# Patient Record
Sex: Male | Born: 1985
Health system: Southern US, Community
[De-identification: ages and names within clinical notes are randomized; demographics above are authoritative.]

## PROBLEM LIST (undated history)

## (undated) HISTORY — PX: INGUINAL HERNIA REPAIR: SUR1180

---
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
## 2008-09-25 ENCOUNTER — Ambulatory Visit: Payer: Self-pay | Admitting: Diagnostic Radiology

## 2008-09-25 ENCOUNTER — Emergency Department (HOSPITAL_BASED_OUTPATIENT_CLINIC_OR_DEPARTMENT_OTHER): Admission: EM | Admit: 2008-09-25 | Discharge: 2008-09-25 | Payer: Self-pay | Admitting: Emergency Medicine

## 2009-05-11 ENCOUNTER — Emergency Department (HOSPITAL_COMMUNITY): Admission: EM | Admit: 2009-05-11 | Discharge: 2009-05-11 | Payer: Self-pay | Admitting: Emergency Medicine

## 2010-02-01 ENCOUNTER — Emergency Department (HOSPITAL_COMMUNITY): Admission: EM | Admit: 2010-02-01 | Discharge: 2010-02-01 | Payer: Self-pay | Admitting: Family Medicine

## 2011-10-01 ENCOUNTER — Emergency Department (HOSPITAL_BASED_OUTPATIENT_CLINIC_OR_DEPARTMENT_OTHER)
Admission: EM | Admit: 2011-10-01 | Discharge: 2011-10-01 | Disposition: A | Discharge status: Home / Self Care | Payer: Self-pay | Attending: Emergency Medicine | Admitting: Emergency Medicine

## 2011-10-01 ENCOUNTER — Emergency Department (INDEPENDENT_AMBULATORY_CARE_PROVIDER_SITE_OTHER): Payer: Self-pay

## 2011-10-01 DIAGNOSIS — S61209A Unspecified open wound of unspecified finger without damage to nail, initial encounter: Secondary | ICD-10-CM

## 2011-10-01 DIAGNOSIS — W208XXA Other cause of strike by thrown, projected or falling object, initial encounter: Secondary | ICD-10-CM | POA: Insufficient documentation

## 2011-10-01 DIAGNOSIS — S68119A Complete traumatic metacarpophalangeal amputation of unspecified finger, initial encounter: Secondary | ICD-10-CM

## 2011-10-01 DIAGNOSIS — IMO0002 Reserved for concepts with insufficient information to code with codable children: Secondary | ICD-10-CM | POA: Insufficient documentation

## 2011-10-01 DIAGNOSIS — X58XXXA Exposure to other specified factors, initial encounter: Secondary | ICD-10-CM

## 2011-10-01 DIAGNOSIS — M25549 Pain in joints of unspecified hand: Secondary | ICD-10-CM

## 2011-10-01 MED ORDER — OXYCODONE-ACETAMINOPHEN 5-325 MG PO TABS
1.0000 | ORAL_TABLET | Freq: Once | ORAL | Status: AC
  Administered 2011-10-01: 1 via ORAL
  Filled 2011-10-01: qty 1

## 2011-10-01 MED ORDER — TETANUS-DIPHTH-ACELL PERTUSSIS 5-2.5-18.5 LF-MCG/0.5 IM SUSP
0.5000 mL | Freq: Once | INTRAMUSCULAR | Status: AC
  Administered 2011-10-01: 0.5 mL via INTRAMUSCULAR

## 2011-10-01 MED ORDER — LIDOCAINE HCL (PF) 1 % IJ SOLN: 5.0000 mL | Freq: Once | INTRAMUSCULAR | Status: DC

## 2011-10-01 MED ORDER — OXYCODONE-ACETAMINOPHEN 5-325 MG PO TABS: 1.0000 | ORAL_TABLET | Freq: Four times a day (QID) | ORAL | Status: AC | PRN

## 2011-10-01 MED ORDER — TETANUS-DIPHTH-ACELL PERTUSSIS 5-2.5-18.5 LF-MCG/0.5 IM SUSP
INTRAMUSCULAR | Status: AC
  Administered 2011-10-01: 0.5 mL via INTRAMUSCULAR
  Filled 2011-10-01: qty 0.5

## 2011-10-01 NOTE — ED Provider Notes (Signed)
History     CSN: [id number] Arrival date & time: 10/01/2011  1:59 AM   First MD Initiated Contact with Patient 10/01/11 0205      Chief Complaint  Patient presents with  . Finger Injury    (Consider location/radiation/quality/duration/timing/severity/associated sxs/prior treatment) HPI Patient is a 25 year old male who presents today after sustaining a complete fingertip avulsion injury. Patient dropped a straight razor on the radial surface of his left first digit. Patient essentially shaved off an area 1 cm in length and a half centimeter in diameter but also caught the corner of the affected digit fingernail on the radial aspect.  Avulsed segment is brought with the patient and is already ischemic in appearance. It is also only a 1-2 mm thick. Patient has bleeding but no other significant injuries. Does not appear to be bone involvement. Patient does not recall when his last tetanus shot was. 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 He's not a smoker. Patient is otherwise uninjured and neurovascularly intact. There are no other associated or modifying factors. Patient reports his pain as a 5/10. Pain and worse with touching and better with not touching his wound.   History reviewed. No pertinent past medical history.  Past Surgical History  Procedure Date  . Inguinal hernia repair     History reviewed. No pertinent family history.  History  Substance Use Topics  . Smoking status: Never Smoker   . Smokeless tobacco: Never Used  . Alcohol Use: Yes      Review of Systems  Constitutional: Negative.   HENT: Negative.   Eyes: Negative.   Respiratory: Negative.   Cardiovascular: Negative.   Gastrointestinal: Negative.   Genitourinary: Negative.   Musculoskeletal:       Left fingertip injury  Skin:       Fingertip avulsion injury of the left index finger   Neurological: Negative.   Hematological: Negative.   Psychiatric/Behavioral: Negative.   All other  systems reviewed and are negative.    Allergies  Review of patient's allergies indicates no known allergies.  Home Medications  No current outpatient prescriptions on file.  BP 136/77  Pulse 57  Temp(Src) 98.9 F (37.2 C) (Oral)  Resp 16  Ht 6\' 5"  (1.956 m)  Wt 208 lb (94.348 kg)  BMI 24.67 kg/m2  SpO2 99%  Physical Exam  Nursing note and vitals reviewed. Constitutional: He is oriented to person, place, and time. He appears well-developed and well-nourished. No distress.  HENT:  Head: Normocephalic and atraumatic.  Eyes: Conjunctivae and EOM are normal. Pupils are equal, round, and reactive to light.  Neck: Normal range of motion.  Musculoskeletal: He exhibits no edema.       Left index finger has complete avulsion of tissue on the radial aspect with avulsed segment measuring proximally 1 cm in length by 1/2 cm in diameter by 2 mm in thickness. Patient has minimal bleeding from the affected area. There is a small segment of the corner of the fingernail attached to the avulsed area. No bone is exposed. This appears to be a relatively superficial injury. Sensation and range of motion are intact.  Neurological: He is alert and oriented to person, place, and time. No cranial nerve deficit. Coordination normal.  Skin: Skin is warm and dry. No rash noted.       See musculoskeletal for description of left index finger wound  Psychiatric: He has a normal mood and affect.    ED Course  Procedures (including critical care  time)  Labs Reviewed - No data to display Dg Finger Index Left  10/01/2011  *RADIOLOGY REPORT*  Clinical Data: Laceration to distal tip of second finger.  LEFT INDEX FINGER 2+V  Comparison: None.  Findings: Soft tissue avulsion of the distal aspect of the left second finger.  Underlying bones appear intact.  No evidence of acute fracture or subluxation.  No focal bone lesions.  No radiopaque foreign bodies demonstrated in the soft tissues.  IMPRESSION: Soft tissue  avulsion to the distal left second finger.  No underlying bony abnormality.  Original Report Authenticated By: Marlon Pel, M.D.     1. Traumatic amputation of fingertip       MDM  Patient is a well-appearing male who presents with a left index fingertip avulsion injury. Patient is injured on his nondominant hand. He is not having significant amount of bleeding at this time. Patient had his wound cleansed and received a tetanus shot. Plain film was performed to confirm no bony involvement. This was negative. Patient had wound care performed. He was given the name of hand surgeon on call for followup. Patient was instructed that he should followup in 5-7 days. Patient had splint applied to protect the affected digit. He was provided with discharge instructions as well as wound care supplies. He was given a dose of pain medication and discharged with pain medication prescription. He is welcome to return for other emergent concerns. Note was sent via electronic medical record to hand surgeon on call indicating that patient had been referred. No call was placed given the noncritical nature of the patient's injury as well as the time of visit.        Cyndra Numbers, MD 10/01/11 [phone]

## 2011-10-01 NOTE — ED Notes (Signed)
Pt cut the tip of his L index finger off with a razor blade.  Uncertain of when last tetanus was.

## 2011-11-03 ENCOUNTER — Encounter (HOSPITAL_BASED_OUTPATIENT_CLINIC_OR_DEPARTMENT_OTHER): Payer: Self-pay

## 2011-11-03 ENCOUNTER — Emergency Department (HOSPITAL_BASED_OUTPATIENT_CLINIC_OR_DEPARTMENT_OTHER)
Admission: EM | Admit: 2011-11-03 | Discharge: 2011-11-03 | Disposition: A | Discharge status: Home / Self Care | Payer: Self-pay | Attending: Emergency Medicine | Admitting: Emergency Medicine

## 2011-11-03 DIAGNOSIS — R197 Diarrhea, unspecified: Secondary | ICD-10-CM | POA: Insufficient documentation

## 2011-11-03 DIAGNOSIS — R1110 Vomiting, unspecified: Secondary | ICD-10-CM | POA: Insufficient documentation

## 2011-11-03 DIAGNOSIS — R7989 Other specified abnormal findings of blood chemistry: Secondary | ICD-10-CM

## 2011-11-03 DIAGNOSIS — R799 Abnormal finding of blood chemistry, unspecified: Secondary | ICD-10-CM | POA: Insufficient documentation

## 2011-11-03 LAB — BASIC METABOLIC PANEL
BUN: 18 mg/dL (ref 6–23)
Calcium: 9.4 mg/dL (ref 8.4–10.5)
Creatinine, Ser: 1.4 mg/dL — ABNORMAL HIGH (ref 0.50–1.35)
GFR calc Af Amer: 79 mL/min — ABNORMAL LOW (ref >90)
GFR calc non Af Amer: 68 mL/min — ABNORMAL LOW (ref >90)

## 2011-11-03 MED ORDER — SODIUM CHLORIDE 0.9 % IV BOLUS (SEPSIS)
1000.0000 mL | Freq: Once | INTRAVENOUS | Status: AC
  Administered 2011-11-03: 1000 mL via INTRAVENOUS

## 2011-11-03 MED ORDER — ONDANSETRON HCL 4 MG/2ML IJ SOLN
4.0000 mg | Freq: Once | INTRAMUSCULAR | Status: AC
  Administered 2011-11-03: 4 mg via INTRAVENOUS
  Filled 2011-11-03: qty 2

## 2011-11-03 NOTE — ED Provider Notes (Signed)
History     CSN: [id number]  Arrival date & time 11/03/11  1801   First MD Initiated Contact with Patient 11/03/11 1826      No chief complaint on file.   (Consider location/radiation/quality/duration/timing/severity/associated sxs/prior treatment) HPI Comments: Pt states that she was having multiple episodes of diarrhea pt states that he only vomits when he makes himself do it:pt states that he is having cramping diffusely without any localized pain  Patient is a 26 y.o. male presenting with vomiting. The history is provided by the patient. No language interpreter was used.  Emesis  This is a new problem. The current episode started more than 1 week ago. The problem occurs 2 to 4 times per day. The problem has not changed since onset.The maximum temperature recorded prior to his arrival was 100 to 100.9 F. Associated symptoms include abdominal pain and diarrhea.    History reviewed. No pertinent past medical history.  Past Surgical History  Procedure Date  . Inguinal hernia repair     No family history on file.  History  Substance Use Topics  . Smoking status: Never Smoker   . Smokeless tobacco: Never Used  . Alcohol Use: Yes      Review of Systems  Gastrointestinal: Positive for vomiting, abdominal pain and diarrhea.  All other systems reviewed and are negative.    Allergies  Review of patient's allergies indicates no known allergies.  Home Medications   Current Outpatient Rx  Name Route Sig Dispense Refill  . BISMUTH SUBSALICYLATE 262 MG/15ML PO SUSP Oral Take 15 mLs by mouth once as needed. For upset stomach    . SIMETHICONE 80 MG PO CHEW Oral Chew 160 mg by mouth once as needed. For gas      BP 120/75  Pulse 65  Temp(Src) 98.6 F (37 C) (Oral)  Resp 18  Ht 6\' 4"  (1.93 m)  Wt 208 lb (94.348 kg)  BMI 25.32 kg/m2  SpO2 100%  Physical Exam  Nursing note reviewed. Constitutional: He is oriented to person, place, and time. He appears well-developed  and well-nourished.  HENT:  Head: Normocephalic.  Eyes: Conjunctivae and EOM are normal.  Neck: Neck supple.  Cardiovascular: Normal rate and regular rhythm.   Pulmonary/Chest: Effort normal and breath sounds normal.  Abdominal: Soft. Bowel sounds are increased. There is no tenderness.  Musculoskeletal: Normal range of motion.  Neurological: He is alert and oriented to person, place, and time.  Skin: Skin is warm and dry.  Psychiatric: He has a normal mood and affect.    ED Course  Procedures (including critical care time)  Labs Reviewed  BASIC METABOLIC PANEL - Abnormal; Notable for the following:    Creatinine, Ser 1.40 (*)    GFR calc non Af Amer 68 (*)    GFR calc Af Amer 79 (*)    All other components within normal limits   No results found.   1. Vomiting and diarrhea   2. Elevated serum creatinine       MDM  Pt tolerating po here:pt given fluids and discussed the need to have cr rechecked        Teressa Lower, NP 11/03/11 2115

## 2011-11-03 NOTE — ED Provider Notes (Signed)
Medical screening examination/treatment/procedure(s) were performed by non-physician practitioner and as supervising physician I was immediately available for consultation/collaboration.   Nat Christen, MD 11/03/11 2138

## 2014-03-31 ENCOUNTER — Encounter (HOSPITAL_COMMUNITY): Payer: Self-pay | Admitting: Emergency Medicine

## 2014-03-31 ENCOUNTER — Emergency Department (HOSPITAL_COMMUNITY)
Admission: EM | Admit: 2014-03-31 | Discharge: 2014-04-01 | Disposition: A | Discharge status: Home / Self Care | Payer: BC Managed Care – PPO | Attending: Emergency Medicine | Admitting: Emergency Medicine

## 2014-03-31 DIAGNOSIS — R109 Unspecified abdominal pain: Secondary | ICD-10-CM

## 2014-03-31 DIAGNOSIS — R1031 Right lower quadrant pain: Secondary | ICD-10-CM | POA: Insufficient documentation

## 2014-03-31 DIAGNOSIS — E860 Dehydration: Secondary | ICD-10-CM | POA: Insufficient documentation

## 2014-03-31 DIAGNOSIS — R112 Nausea with vomiting, unspecified: Secondary | ICD-10-CM | POA: Insufficient documentation

## 2014-03-31 DIAGNOSIS — R197 Diarrhea, unspecified: Secondary | ICD-10-CM | POA: Insufficient documentation

## 2014-03-31 LAB — CBC
HEMATOCRIT: 43.8 % (ref 39.0–52.0)
HEMOGLOBIN: 14.9 g/dL (ref 13.0–17.0)
MCH: 28.5 pg (ref 26.0–34.0)
MCHC: 34 g/dL (ref 30.0–36.0)
MCV: 83.9 fL (ref 78.0–100.0)
PLATELETS: 182 10*3/uL (ref 150–400)
RBC: 5.22 MIL/uL (ref 4.22–5.81)
RDW: 12.9 % (ref 11.5–15.5)
WBC: 5.7 10*3/uL (ref 4.0–10.5)

## 2014-03-31 LAB — I-STAT TROPONIN, ED: Troponin i, poc: 0.01 ng/mL (ref 0.00–0.08)

## 2014-03-31 NOTE — ED Notes (Signed)
Pt states he has been working out a lot and not eating correctly  Pt states he started to have chest pain last night and it has carried over to today  Pt states the pain is off and on  Pt states the pain is in the center of her chest and feels like something is pressing down on him

## 2014-04-01 ENCOUNTER — Emergency Department (HOSPITAL_COMMUNITY): Payer: BC Managed Care – PPO

## 2014-04-01 ENCOUNTER — Encounter (HOSPITAL_COMMUNITY): Payer: Self-pay

## 2014-04-01 LAB — COMPREHENSIVE METABOLIC PANEL
ALT: 22 U/L (ref 0–53)
AST: 31 U/L (ref 0–37)
Albumin: 4 g/dL (ref 3.5–5.2)
Alkaline Phosphatase: 76 U/L (ref 39–117)
BILIRUBIN TOTAL: 0.3 mg/dL (ref 0.3–1.2)
BUN: 16 mg/dL (ref 6–23)
CALCIUM: 9.6 mg/dL (ref 8.4–10.5)
CHLORIDE: 104 meq/L (ref 96–112)
CO2: 24 meq/L (ref 19–32)
CREATININE: 1.28 mg/dL (ref 0.50–1.35)
GFR, EST AFRICAN AMERICAN: 86 mL/min — AB (ref >90)
GFR, EST NON AFRICAN AMERICAN: 74 mL/min — AB (ref >90)
GLUCOSE: 96 mg/dL (ref 70–99)
Potassium: 3.9 mEq/L (ref 3.7–5.3)
Sodium: 139 mEq/L (ref 137–147)
Total Protein: 7.6 g/dL (ref 6.0–8.3)

## 2014-04-01 LAB — URINALYSIS, ROUTINE W REFLEX MICROSCOPIC
Glucose, UA: NEGATIVE mg/dL
Hgb urine dipstick: NEGATIVE
KETONES UR: NEGATIVE mg/dL
Leukocytes, UA: NEGATIVE
NITRITE: NEGATIVE
PH: 6 (ref 5.0–8.0)
PROTEIN: 30 mg/dL — AB
Specific Gravity, Urine: 1.035 — ABNORMAL HIGH (ref 1.005–1.030)
Urobilinogen, UA: 0.2 mg/dL (ref 0.0–1.0)

## 2014-04-01 LAB — URINE MICROSCOPIC-ADD ON

## 2014-04-01 LAB — CK: Total CK: 293 U/L — ABNORMAL HIGH (ref 7–232)

## 2014-04-01 MED ORDER — IOHEXOL 300 MG/ML  SOLN
50.0000 mL | Freq: Once | INTRAMUSCULAR | Status: AC | PRN
  Administered 2014-04-01: 50 mL via ORAL

## 2014-04-01 MED ORDER — ONDANSETRON HCL 4 MG/2ML IJ SOLN
4.0000 mg | Freq: Once | INTRAMUSCULAR | Status: AC
  Administered 2014-04-01: 4 mg via INTRAVENOUS
  Filled 2014-04-01: qty 2

## 2014-04-01 MED ORDER — ONDANSETRON 8 MG PO TBDP: 8.0000 mg | ORAL_TABLET | Freq: Three times a day (TID) | ORAL | Status: DC | PRN

## 2014-04-01 MED ORDER — SODIUM CHLORIDE 0.9 % IV BOLUS (SEPSIS)
1000.0000 mL | Freq: Once | INTRAVENOUS | Status: AC
  Administered 2014-04-01: 1000 mL via INTRAVENOUS

## 2014-04-01 MED ORDER — MORPHINE SULFATE 4 MG/ML IJ SOLN
4.0000 mg | Freq: Once | INTRAMUSCULAR | Status: AC
  Administered 2014-04-01: 4 mg via INTRAVENOUS
  Filled 2014-04-01: qty 1

## 2014-04-01 MED ORDER — IOHEXOL 300 MG/ML  SOLN
100.0000 mL | Freq: Once | INTRAMUSCULAR | Status: AC | PRN
  Administered 2014-04-01: 100 mL via INTRAVENOUS

## 2014-04-01 NOTE — ED Notes (Signed)
Pt. Denied of chest pain upon this assessment , claimed that his stomach hurts most this time because poor food intake. Pt. Also claimed of diarrhea started 2 days ago.

## 2014-04-01 NOTE — Discharge Instructions (Signed)
Your CT scan today did not show any signs of appendicitis.  Drink plenty of water.  Take nausea medication as prescribed.  Rest.  Avoid strenuous activity for 2-3 days.  Stick to a bland diet until feeling better.   Abdominal Pain, Adult Many things can cause abdominal pain. Usually, abdominal pain is not caused by a disease and will improve without treatment. It can often be observed and treated at home. Your health care provider will do a physical exam and possibly order blood tests and X-rays to help determine the seriousness of your pain. However, in many cases, more time must pass before a clear cause of the pain can be found. Before that point, your health care provider may not know if you need more testing or further treatment. HOME CARE INSTRUCTIONS  Monitor your abdominal pain for any changes. The following actions may help to alleviate any discomfort you are experiencing:  Only take over-the-counter or prescription medicines as directed by your health care provider.  Do not take laxatives unless directed to do so by your health care provider.  Try a clear liquid diet (broth, tea, or water) as directed by your health care provider. Slowly move to a bland diet as tolerated. SEEK MEDICAL CARE IF:  You have unexplained abdominal pain.  You have abdominal pain associated with nausea or diarrhea.  You have pain when you urinate or have a bowel movement.  You experience abdominal pain that wakes you in the night.  You have abdominal pain that is worsened or improved by eating food.  You have abdominal pain that is worsened with eating fatty foods. SEEK IMMEDIATE MEDICAL CARE IF:   Your pain does not go away within 2 hours.  You have a fever.  You keep throwing up (vomiting).  Your pain is felt only in portions of the abdomen, such as the right side or the left lower portion of the abdomen.  You pass bloody or black tarry stools. MAKE SURE YOU:  Understand these instructions.    Will watch your condition.   Will get help right away if you are not doing well or get worse.  Document Released: 07/17/2005 Document Revised: 07/28/2013 Document Reviewed: 06/16/2013 [facility] Patient Information 2014 [place], [place].  Dehydration, Adult Dehydration is when you lose more fluids from the body than you take in. Vital organs like the kidneys, brain, and heart cannot function without a proper amount of fluids and salt. Any loss of fluids from the body can cause dehydration.  CAUSES   Vomiting.  Diarrhea.  Excessive sweating.  Excessive urine output.  Fever. SYMPTOMS  Mild dehydration  Thirst.  Dry lips.  Slightly dry mouth. Moderate dehydration  Very dry mouth.  Sunken eyes.  Skin does not bounce back quickly when lightly pinched and released.  Dark urine and decreased urine production.  Decreased tear production.  Headache. Severe dehydration  Very dry mouth.  Extreme thirst.  Rapid, weak pulse (more than 100 beats per minute at rest).  Cold hands and feet.  Not able to sweat in spite of heat and temperature.  Rapid breathing.  Blue lips.  Confusion and lethargy.  Difficulty being awakened.  Minimal urine production.  No tears. DIAGNOSIS  Your caregiver will diagnose dehydration based on your symptoms and your exam. Blood and urine tests will help confirm the diagnosis. The diagnostic evaluation should also identify the cause of dehydration. TREATMENT  Treatment of mild or moderate dehydration can often be done at home by increasing the  amount of fluids that you drink. It is best to drink small amounts of fluid more often. Drinking too much at one time can make vomiting worse. Refer to the home care instructions below. Severe dehydration needs to be treated at the hospital where you will probably be given intravenous (IV) fluids that contain water and electrolytes. HOME CARE INSTRUCTIONS   Ask your caregiver about specific  rehydration instructions.  Drink enough fluids to keep your urine clear or pale yellow.  Drink small amounts frequently if you have nausea and vomiting.  Eat as you normally do.  Avoid:  Foods or drinks high in sugar.  Carbonated drinks.  Juice.  Extremely hot or cold fluids.  Drinks with caffeine.  Fatty, greasy foods.  Alcohol.  Tobacco.  Overeating.  Gelatin desserts.  Wash your hands well to avoid spreading bacteria and viruses.  Only take over-the-counter or prescription medicines for pain, discomfort, or fever as directed by your caregiver.  Ask your caregiver if you should continue all prescribed and over-the-counter medicines.  Keep all follow-up appointments with your caregiver. SEEK MEDICAL CARE IF:  You have abdominal pain and it increases or stays in one area (localizes).  You have a rash, stiff neck, or severe headache.  You are irritable, sleepy, or difficult to awaken.  You are weak, dizzy, or extremely thirsty. SEEK IMMEDIATE MEDICAL CARE IF:   You are unable to keep fluids down or you get worse despite treatment.  You have frequent episodes of vomiting or diarrhea.  You have blood or green matter (bile) in your vomit.  You have blood in your stool or your stool looks black and tarry.  You have not urinated in 6 to 8 hours, or you have only urinated a small amount of very dark urine.  You have a fever.  You faint. MAKE SURE YOU:   Understand these instructions.  Will watch your condition.  Will get help right away if you are not doing well or get worse. Document Released: 10/07/2005 Document Revised: 12/30/2011 Document Reviewed: 05/27/2011 [facility] Patient Information 2014 [place], [place].  Diet for Diarrhea, Adult Frequent, runny stools (diarrhea) may be caused or worsened by food or drink. Diarrhea may be relieved by changing your diet. Since diarrhea can last up to 7 days, it is easy for you to lose too much fluid from the  body and become dehydrated. Fluids that are lost need to be replaced. Along with a modified diet, make sure you drink enough fluids to keep your urine clear or pale yellow. DIET INSTRUCTIONS  Ensure adequate fluid intake (hydration): have 1 cup (8 oz) of fluid for each diarrhea episode. Avoid fluids that contain simple sugars or sports drinks, fruit juices, whole milk products, and sodas. Your urine should be clear or pale yellow if you are drinking enough fluids. Hydrate with an oral rehydration solution that you can purchase at pharmacies, retail stores, and online. You can prepare an oral rehydration solution at home by mixing the following ingredients together:    tsp table salt.   tsp baking soda.   tsp salt substitute containing potassium chloride.  1  tablespoons sugar.  1 L (34 oz) of water.  Certain foods and beverages may increase the speed at which food moves through the gastrointestinal (GI) tract. These foods and beverages should be avoided and include:  Caffeinated and alcoholic beverages.  High-fiber foods, such as raw fruits and vegetables, nuts, seeds, and whole grain breads and cereals.  Foods and beverages sweetened  with sugar alcohols, such as xylitol, sorbitol, and mannitol.  Some foods may be well tolerated and may help thicken stool including:  Starchy foods, such as rice, toast, pasta, low-sugar cereal, oatmeal, grits, baked potatoes, crackers, and bagels.   Bananas.   Applesauce.  Add probiotic-rich foods to help increase healthy bacteria in the GI tract, such as yogurt and fermented milk products. RECOMMENDED FOODS AND BEVERAGES Starches Choose foods with less than 2 g of fiber per serving.  Recommended:  White, [place], and pita breads, plain rolls, buns, bagels. Plain muffins, matzo. Soda, saltine, or graham crackers. Pretzels, melba toast, zwieback. Cooked cereals made with water: cornmeal, farina, cream cereals. Dry cereals: refined corn, wheat,  rice. Potatoes prepared any way without skins, refined macaroni, spaghetti, noodles, refined rice.  Avoid:  Bread, rolls, or crackers made with whole wheat, multi-grains, rye, bran seeds, nuts, or coconut. Corn tortillas or taco shells. Cereals containing whole grains, multi-grains, bran, coconut, nuts, raisins. Cooked or dry oatmeal. Coarse wheat cereals, granola. Cereals advertised as "high-fiber." Potato skins. Whole grain pasta, wild or brown rice. Popcorn. Sweet potatoes, yams. Sweet rolls, doughnuts, waffles, pancakes, sweet breads. Vegetables  Recommended: Strained tomato and vegetable juices. Most well-cooked and canned vegetables without seeds. Fresh: Tender lettuce, cucumber without the skin, cabbage, spinach, bean sprouts.  Avoid: Fresh, cooked, or canned: Artichokes, baked beans, beet greens, broccoli, Brussels sprouts, corn, kale, legumes, peas, sweet potatoes. Cooked: Green or red cabbage, spinach. Avoid large servings of any vegetables because vegetables shrink when cooked, and they contain more fiber per serving than fresh vegetables. Fruit  Recommended: Cooked or canned: Apricots, applesauce, cantaloupe, cherries, fruit cocktail, grapefruit, grapes, kiwi, mandarin oranges, peaches, pears, plums, watermelon. Fresh: Apples without skin, ripe banana, grapes, cantaloupe, cherries, grapefruit, peaches, oranges, plums. Keep servings limited to  cup or 1 piece.  Avoid: Fresh: Apples with skin, apricots, mangoes, pears, raspberries, strawberries. Prune juice, stewed or dried prunes. Dried fruits, raisins, dates. Large servings of all fresh fruits. Protein  Recommended: Ground or well-cooked tender beef, ham, veal, lamb, pork, or poultry. Eggs. Fish, oysters, shrimp, lobster, other seafoods. Liver, organ meats.  Avoid: Tough, fibrous meats with gristle. Peanut butter, smooth or chunky. Cheese, nuts, seeds, legumes, dried peas, beans, lentils. Dairy  Recommended: Yogurt, lactose-free  milk, kefir, drinkable yogurt, buttermilk, soy milk, or plain hard cheese.  Avoid: Milk, chocolate milk, beverages made with milk, such as milkshakes. Soups  Recommended: Bouillon, broth, or soups made from allowed foods. Any strained soup.  Avoid: Soups made from vegetables that are not allowed, cream or milk-based soups. Desserts and Sweets  Recommended: Sugar-free gelatin, sugar-free frozen ice pops made without sugar alcohol.  Avoid: Plain cakes and cookies, pie made with fruit, pudding, custard, cream pie. Gelatin, fruit, ice, sherbet, frozen ice pops. Ice cream, ice milk without nuts. Plain hard candy, honey, jelly, molasses, syrup, sugar, chocolate syrup, gumdrops, marshmallows. Fats and Oils  Recommended: Limit fats to less than 8 tsp per day.  Avoid: Seeds, nuts, olives, avocados. Margarine, butter, cream, mayonnaise, salad oils, plain salad dressings. Plain gravy, crisp bacon without rind. Beverages  Recommended: Water, decaffeinated teas, oral rehydration solutions, sugar-free beverages not sweetened with sugar alcohols.  Avoid: Fruit juices, caffeinated beverages (coffee, tea, soda), alcohol, sports drinks, or lemon-lime soda. Condiments  Recommended: Ketchup, mustard, horseradish, vinegar, cocoa powder. Spices in moderation: allspice, basil, bay leaves, celery powder or leaves, cinnamon, cumin powder, curry powder, ginger, mace, marjoram, onion or garlic powder, oregano, paprika, parsley flakes, ground pepper, rosemary,  sage, savory, tarragon, thyme, turmeric.  Avoid: Coconut, honey. Document Released: 12/28/2003 Document Revised: 07/01/2012 Document Reviewed: 02/21/2012 [facility] Patient Information 2014 [place] Club[place]Care, [place].  Diarrhea Diarrhea is watery poop (stool). It can make you feel weak, tired, thirsty, or give you a dry mouth (signs of dehydration). Watery poop is a sign of another problem, most often an infection. It often lasts 2 3 days. It can last longer if it is a  sign of something serious. Take care of yourself as told by your doctor. HOME CARE   Drink 1 cup (8 ounces) of fluid each time you have watery poop.  Do not drink the following fluids:  Those that contain simple sugars (fructose, glucose, galactose, lactose, sucrose, maltose).  Sports drinks.  Fruit juices.  Whole milk products.  Sodas.  Drinks with caffeine (coffee, tea, soda) or alcohol.  Oral rehydration solution may be used if the doctor says it is okay. You may make your own solution. Follow this recipe:    teaspoon table salt.   teaspoon baking soda.   teaspoon salt substitute containing potassium chloride.  1 tablespoons sugar.  1 liter (34 ounces) of water.  Avoid the following foods:  High fiber foods, such as raw fruits and vegetables.  Nuts, seeds, and whole grain breads and cereals.   Those that are sweetened with sugar alcohols (xylitol, sorbitol, mannitol).  Try eating the following foods:  Starchy foods, such as rice, toast, pasta, low-sugar cereal, oatmeal, baked potatoes, crackers, and bagels.  Bananas.  Applesauce.  Eat probiotic-rich foods, such as yogurt and milk products that are fermented.  Wash your hands well after each time you have watery poop.  Only take medicine as told by your doctor.  Take a warm bath to help lessen burning or pain from having watery poop. GET HELP RIGHT AWAY IF:   You cannot drink fluids without throwing up (vomiting).  You keep throwing up.  You have blood in your poop, or your poop looks black and tarry.  You do not pee (urinate) in 6 8 hours, or there is only a small amount of very dark pee.  You have belly (abdominal) pain that gets worse or stays in the same spot (localizes).  You are weak, dizzy, confused, or lightheaded.  You have a very bad headache.  Your watery poop gets worse or does not get better.  You have a fever or lasting symptoms for more than 2 3 days.  You have a fever and  your symptoms suddenly get worse. MAKE SURE YOU:   Understand these instructions.  Will watch your condition.  Will get help right away if you are not doing well or get worse. Document Released: 03/25/2008 Document Revised: 07/01/2012 Document Reviewed: 06/14/2012 [facility] Patient Information 2014 [place], [place].  Nausea and Vomiting Nausea means you feel sick to your stomach. Throwing up (vomiting) is a reflex where stomach contents come out of your mouth. HOME CARE   Take medicine as told by your doctor.  Do not force yourself to eat. However, you do need to drink fluids.  If you feel like eating, eat a normal diet as told by your doctor.  Eat rice, wheat, potatoes, bread, lean meats, yogurt, fruits, and vegetables.  Avoid high-fat foods.  Drink enough fluids to keep your pee (urine) clear or pale yellow.  Ask your doctor how to replace body fluid losses (rehydrate). Signs of body fluid loss (dehydration) include:  Feeling very thirsty.  Dry lips and mouth.  Feeling dizzy.  Dark pee.  Peeing less than normal.  Feeling confused.  Fast breathing or heart rate. GET HELP RIGHT AWAY IF:   You have blood in your throw up.  You have black or bloody poop (stool).  You have a bad headache or stiff neck.  You feel confused.  You have bad belly (abdominal) pain.  You have chest pain or trouble breathing.  You do not pee at least once every 8 hours.  You have cold, clammy skin.  You keep throwing up after 24 to 48 hours.  You have a fever. MAKE SURE YOU:   Understand these instructions.  Will watch your condition.  Will get help right away if you are not doing well or get worse. Document Released: 03/25/2008 Document Revised: 12/30/2011 Document Reviewed: 03/08/2011 [facility] Patient Information 2014 [place], [place].   Emergency Department Resource Guide 1) Find a Doctor and Pay Out of Pocket Although you won't have to find out who is covered by your  insurance plan, it is a good idea to ask around and get recommendations. You will then need to call the office and see if the doctor you have chosen will accept you as a new patient and what types of options they offer for patients who are self-pay. Some doctors offer discounts or will set up payment plans for their patients who do not have insurance, but you will need to ask so you aren't surprised when you get to your appointment.  2) Contact Your Local Health Department Not all health departments have doctors that can see patients for sick visits, but many do, so it is worth a call to see if yours does. If you don't know where your local health department is, you can check in your phone book. The CDC also has a tool to help you locate your state's health department, and many state websites also have listings of all of their local health departments.  3) Find a Walk-in Clinic If your illness is not likely to be very severe or complicated, you may want to try a walk in clinic. These are popping up all over the country in pharmacies, drugstores, and shopping centers. They're usually staffed by nurse practitioners or physician assistants that have been trained to treat common illnesses and complaints. They're usually fairly quick and inexpensive. However, if you have serious medical issues or chronic medical problems, these are probably not your best option.  No Primary Care Doctor: - Call Health Connect at  [phone] - they can help you locate a primary care doctor that  accepts your insurance, provides certain services, etc. - Physician Referral Service- [phone]  Chronic Pain Problems: Organization         Address  Phone   Notes  [facility]  [phone] Patients need to be referred by their primary care doctor.   Medication Assistance: Organization         Address  Phone   Notes  [facility] Medication [facility] [address] [place] [place], [place] [address] [phone] --Must be a resident of [facility] -- Must have NO insurance coverage whatsoever (no Medicaid/ Medicare, etc.) -- The pt. MUST have a primary care doctor that directs their care regularly and follows them in the community   [facility]  [phone]   [employer]  [phone]    Agencies that provide inexpensive medical care: Organization         Address  Phone   Notes  [facility]  [phone]   [facility]    [phone]   [facility] [address] [place], [place] [address] [phone]   [facility] [address], [place] [phone]   [facility]anned Parenthood    [phone]   [facility]    [phone]   [facility]  [address], [place] Phone:  [phone], Fax:  [phone] Hours of Operation:  9 am - 6 pm, M-F.  Also accepts Medicaid/Medicare and self-pay.  [facility]  [address], [place] Phone: [phone], Fax: [phone]. Hours of Operation:  8:30 am - 5:30 pm, M-F.  Also accepts Medicaid and self-pay.  [facility] [place] [address] [place] Phone: [phone]   [facility] [address] Natasha Bence [place], [place] [phone], Ext. [phone] Mondays & Thursdays: 7-9 AM.  First 15 patients are seen on a first come, first serve basis.    Medicaid-accepting [facility] Providers:  Organization         Address  Phone   Notes  [facility] [address], [place] [phone] Also accepts self-pay patients.  [facility] [address] Laurell Josephs [place], [place]  [phone]   [facility] [address], [place] [phone]   [facility] [address], [place] [phone]   Renaye Rakers [address], [place]   [phone] Only accepts [place] Access [place] patients after they have their name applied to their card.   Self-Pay (no insurance) in [facility]:  Organization         Address  Phone   Notes  Sickle Cell Patients, [facility] [address] [place], [place] [phone]   [facility] [address] [place], [place] [phone]   [facility]  [address], [place] [phone]   [facility]/Dr. Osei-Bonsu  [address] [place] or [address], [place] [phone] Phone number for both [place] and [place] locations is the same.  [facility] [address], [place] [phone]   [facility] [address], [place] or [address] [phone]   [facility] [address] [place] [phone], phone; [phone], fax Sees patients 1st and 3rd Saturday of every month.  Must not qualify for public or private insurance (i.e. Medicaid, Medicare, [place] Health Choice, Veterans' Benefits)  Household income should be no more than 200% of the poverty level The clinic cannot treat you if you are pregnant or think you are pregnant  Sexually transmitted diseases are not treated at the clinic.    Dental Care: Organization         Address  Phone  Notes  [facility] [address] [place], [place] [phone] Accepts children up to age 57 who are enrolled in [place] or [place] Health Choice; pregnant women with a Medicaid card; and children who have applied for Medicaid or [place] Health Choice, but were declined, whose parents can pay a reduced fee at time of service.  [facility] [employer]  [facility] [place]  [address], [place] [phone] Accepts children up to age 59 who are enrolled  in Medicaid or [place] Health Choice; pregnant women with a Medicaid card; and children who have applied for Medicaid or [place] Health Choice, but were declined, whose parents can pay a reduced fee at time of service.  [facility]  [address] [place], [place] [phone] Patients are seen by appointment only. Walk-ins are not accepted. [facility] will see patients 18 years of age and older. Monday - Tuesday (8am-5pm) Most Wednesdays (8:30-5pm) $30 per visit, cash only  [facility]  [address], [facility] [phone] Patients are seen by appointment only. Walk-ins are not accepted. [facility] will see patients 13 years of age and older. One Wednesday Evening (Monthly: Volunteer Based).  $30 per visit, cash only  [employer]  [phone] for adults; Children under age 29, call Graduate Pediatric Dentistry at [phone]. Children aged 55-14, please call [phone] to request a pediatric application.  Dental services are provided in all areas of dental care including fillings, crowns and bridges, complete and partial dentures, implants, gum treatment, root canals, and extractions. Preventive care is also provided. Treatment is provided to both adults and children. Patients are selected via a lottery and there is often a waiting list.   [facility] [address], [place]  [phone] [email or web address]   [facility] [address] [place], [place] [phone], Ext. [phone] Second and Fourth Thursday of each month, opens at 6:30 AM; Clinic ends at 9 AM.  Patients are seen on a first-come first-served basis, and a limited number are seen during each clinic.   [facility]  [address] Ether Griffins [place], [place] [phone]   Eligibility Requirements You must have lived in [place], [place], or [place] counties for at least the last three months.   You cannot be  eligible for state or federal sponsored [place], including [employer], [place], or [employer].   You generally cannot be eligible for healthcare insurance through your employer.    How to apply: Eligibility screenings are held every Tuesday and Wednesday afternoon from 1:00 pm until 4:00 pm. You do not need an appointment for the interview!  [facility] [facility] [address], [place], [place] [phone]   [facility] Health Department  [phone]   [facility] Department  [phone]   [facility] Department  [phone]    Behavioral Health Resources in the Community: Intensive Outpatient Programs Organization         Address  Phone  Notes  [facility] [address] [place], [place] [phone]   [facility] [address], [place], [place] [phone]   [facility] [address] [place], [place]  [phone]   [facility] [address]  [place], [place] [phone] or [phone]   Substance Abuse Resources Organization         Address  Phone  Notes  [facility]  [phone]   [facility]  [phone]   [facility] [place]  [phone]   Floydene Flock  [phone]   Residential & Outpatient Substance Abuse Program  [phone]   Psychological Services Organization         Address  Phone  Notes  [facility]  [phone]   [employer]   [facility] [address] [place] [phone] or [phone]    Mobile Crisis Teams Organization         Address  Phone  Notes  [facility], Mobile Crisis Care Unit  [phone]   [facility]  [address] [place], [place] [phone]   Doristine Locks [address] [place] [phone]    Self-Help/Support Groups Organization          Address  Phone             Notes  [facility] [place] - variety of support groups  [phone]- I7437963 Call for more information  Narcotics Anonymous (NA), [facility] [address], [employer] [place]  2 meetings at this location   Statistician         Address  Phone  Notes  [facility] [address],    [place]  [phone]   [facility]  [address], [place] [address], [place], [place] [phone]   [facility] [address] [place], [place] [phone] Admissions: 8am-3pm M-F  [facility] [address]    [place], [place] [phone]   [facility] [address] [place], [place], [place] [phone]   [facility] [address]  [place], [place] [phone]   [facility] - Intensive Outpatient [address], [place], [place] [phone]   [facility] ([facility] [address] [place].,  [place], [place] [phone] or [phone]   [facility] ([facility] [address], [place], [place] [phone] Accepts Medicaid  [facility] [address],  [place] [phone] Substance Abuse/Addiction Treatment   [facility] Organization         Address  Phone  Notes  [facility]  [phone]   Angie Fava, PhD [address] [place], [place]   [phone] or [phone]   [facility] Behavioral   [address] [place], [place] [phone]   [facility] [address] [place], [place] [phone] Insurance/Medicaid/sponsorship through [facility] [address]                                    [place], [place] [phone] Therapy/tele-psych/case  [facility] [address], [place] [phone]    Dr. Lolly Mustache  [phone]   [facility]  [employer]  [facility] Dept. 1) [address], [place] 2) [address], [place] 3)  [address], [place] [phone]  [phone]   [facility] Child Abuse Hotline [phone] or 9700774637 (After Hours)

## 2014-04-01 NOTE — ED Provider Notes (Signed)
CSN: [id number]     Arrival date & time 03/31/14  2238 History   First MD Initiated Contact with Patient 04/01/14 0024     Chief Complaint  Patient presents with  . Chest Pain     (Consider location/radiation/quality/duration/timing/severity/associated sxs/prior Treatment) HPI 28 year old male presents to emergency room from home with complaint of abdominal pain.  Patient initially reported to triage nurse that he had chest pain, but he denies this at this time.  He reports he's had 2 days of an upset stomach with diarrhea and burning.  Patient had vomiting on Tuesday, chest pain yesterday.  He has been feeling dehydrated for the last 3 days.  Patient played basketball, but was unable to complete due to feeling fatigued.  Patient does not feel that he is eating well. History reviewed. No pertinent past medical history. Past Surgical History  Procedure Laterality Date  . Inguinal hernia repair     Family History  Problem Relation Age of Onset  . Diabetes Other   . CAD Other   . Hypertension Other    History  Substance Use Topics  . Smoking status: Never Smoker   . Smokeless tobacco: Never Used  . Alcohol Use: Yes     Comment: occ    Review of Systems   See History of Present Illness; otherwise all other systems are reviewed and negative  Allergies  Review of patient's allergies indicates no known allergies.  Home Medications   Prior to Admission medications   Not on File   BP 133/64  Pulse 74  Temp(Src) 98.9 F (37.2 C) (Oral)  Resp 18  SpO2 100% Physical Exam  Nursing note and vitals reviewed. Constitutional: He is oriented to person, place, and time. He appears well-developed and well-nourished. No distress.  HENT:  Head: Normocephalic and atraumatic.  Nose: Nose normal.  Mouth/Throat: Oropharynx is clear and moist.  Eyes: Conjunctivae and EOM are normal. Pupils are equal, round, and reactive to light.  Neck: Normal range of motion. Neck supple. No JVD  present. No tracheal deviation present. No thyromegaly present.  Cardiovascular: Normal rate, regular rhythm, normal heart sounds and intact distal pulses.  Exam reveals no gallop and no friction rub.   No murmur heard. Pulmonary/Chest: Effort normal and breath sounds normal. No stridor. No respiratory distress. He has no wheezes. He has no rales. He exhibits no tenderness.  Abdominal: Soft. Bowel sounds are normal. He exhibits no distension and no mass. There is tenderness (tender to palpation in right lower quadrant without rebound or guarding). There is no rebound and no guarding.  Musculoskeletal: Normal range of motion. He exhibits no edema and no tenderness.  Lymphadenopathy:    He has no cervical adenopathy.  Neurological: He is alert and oriented to person, place, and time. He has normal reflexes. No cranial nerve deficit. He exhibits normal muscle tone. Coordination normal.  Skin: Skin is dry. No rash noted. No erythema. No pallor.  Psychiatric: He has a normal mood and affect. His behavior is normal. Judgment and thought content normal.    ED Course  Procedures (including critical care time) Labs Review Labs Reviewed  COMPREHENSIVE METABOLIC PANEL - Abnormal; Notable for the following:    GFR calc non Af Amer 74 (*)    GFR calc Af Amer 86 (*)    All other components within normal limits  URINALYSIS, ROUTINE W REFLEX MICROSCOPIC - Abnormal; Notable for the following:    Color, Urine AMBER (*)    Specific Gravity, Urine  1.035 (*)    Bilirubin Urine SMALL (*)    Protein, ur 30 (*)    All other components within normal limits  CK - Abnormal; Notable for the following:    Total CK 293 (*)    All other components within normal limits  URINE MICROSCOPIC-ADD ON - Abnormal; Notable for the following:    Bacteria, UA FEW (*)    All other components within normal limits  CBC  I-STAT TROPOININ, ED    Imaging Review Ct Abdomen Pelvis W Contrast  04/01/2014   CLINICAL DATA:  Right  lower quadrant pain, diarrhea.  EXAM: CT ABDOMEN AND PELVIS WITH CONTRAST  TECHNIQUE: Multidetector CT imaging of the abdomen and pelvis was performed using the standard protocol following bolus administration of intravenous contrast.  CONTRAST:  100 cc of Omni 300  COMPARISON:  None.  FINDINGS: Mild subsegmental atelectasis seen dependently within the visualized lung bases.  The liver demonstrates a normal contrast enhanced appearance. Calcified stone present within the gallbladder lumen. No biliary ductal dilatation. The spleen, adrenal glands, and pancreas demonstrate a normal contrast enhanced appearance.  The kidneys are equal size with symmetric enhancement. No nephrolithiasis, hydronephrosis, or focal enhancing renal mass.  Stomach is within normal limits. No evidence of bowel obstruction. Appendix is visualized in the right lower quadrant and is of normal caliber and appearance without associated inflammatory changes to suggest acute appendicitis. No abnormal wall thickening, mucosal enhancement, or inflammatory fat stranding seen about the bowels.  Bladder is within normal limits.  Prostate is unremarkable.  No free air or fluid. No enlarged intra-abdominal pelvic lymph nodes. Normal intravascular enhancement seen throughout the abdomen and pelvis.  No acute osseous abnormality. No worrisome lytic or blastic osseous lesions.  IMPRESSION: 1. No CT evidence of acute intra-abdominal pelvic process. 2. Normal appendix. 3. Cholelithiasis without CT evidence of acute cholecystitis.   Electronically Signed   By: Rise MuBenjamin  McClintock M.D.   On: 04/01/2014 04:48     EKG Interpretation   Date/Time:  Thursday March 31 2014 22:47:11 EDT Ventricular Rate:  68 PR Interval:  168 QRS Duration: 81 QT Interval:  400 QTC Calculation: 425 R Axis:   71 Text Interpretation:  Sinus rhythm Probable left atrial enlargement No old  tracing to compare Confirmed by Welby Montminy  MD, Fusako Tanabe ([id number]) on 03/31/2014  11:55:25 PM       MDM   Final diagnoses:  Abdominal pain  Nausea vomiting and diarrhea  Dehydration    28 year old male with nausea vomiting and diarrhea over the last few days.  Labs are unremarkable.  Repeat abdominal exam shows persistent pain in right lower quadrant.  Patient reports he doesn't feel much better.  Plan for CT abdomen pelvis for possible appendicitis.  CT scan without signs of appendicitis.  Patient feeling better has tolerated contrast.  Plan to DC home with Zofran.  Patient instructed to stick to a bland diet and reduce activity until he is feeling better.    Olivia Mackielga M Verlyn Lambert, MD 04/01/14 [id number]

## 2015-03-26 DIAGNOSIS — Y9389 Activity, other specified: Secondary | ICD-10-CM | POA: Insufficient documentation

## 2015-03-26 DIAGNOSIS — M62830 Muscle spasm of back: Secondary | ICD-10-CM | POA: Insufficient documentation

## 2015-03-26 DIAGNOSIS — Y92410 Unspecified street and highway as the place of occurrence of the external cause: Secondary | ICD-10-CM | POA: Insufficient documentation

## 2015-03-26 DIAGNOSIS — Y998 Other external cause status: Secondary | ICD-10-CM | POA: Diagnosis not present

## 2015-03-26 DIAGNOSIS — S3992XA Unspecified injury of lower back, initial encounter: Secondary | ICD-10-CM | POA: Diagnosis not present

## 2015-03-27 ENCOUNTER — Emergency Department (HOSPITAL_BASED_OUTPATIENT_CLINIC_OR_DEPARTMENT_OTHER)
Admission: EM | Admit: 2015-03-27 | Discharge: 2015-03-27 | Disposition: A | Discharge status: Home / Self Care | Payer: No Typology Code available for payment source | Attending: Emergency Medicine | Admitting: Emergency Medicine

## 2015-03-27 ENCOUNTER — Emergency Department (HOSPITAL_BASED_OUTPATIENT_CLINIC_OR_DEPARTMENT_OTHER): Payer: No Typology Code available for payment source

## 2015-03-27 ENCOUNTER — Encounter (HOSPITAL_BASED_OUTPATIENT_CLINIC_OR_DEPARTMENT_OTHER): Payer: Self-pay | Admitting: *Deleted

## 2015-03-27 DIAGNOSIS — M62830 Muscle spasm of back: Secondary | ICD-10-CM

## 2015-03-27 MED ORDER — IBUPROFEN 800 MG PO TABS: 800.0000 mg | ORAL_TABLET | Freq: Three times a day (TID) | ORAL | Status: DC

## 2015-03-27 MED ORDER — METHOCARBAMOL 500 MG PO TABS: 500.0000 mg | ORAL_TABLET | Freq: Two times a day (BID) | ORAL | Status: DC

## 2015-03-27 MED ORDER — IBUPROFEN 800 MG PO TABS
800.0000 mg | ORAL_TABLET | Freq: Once | ORAL | Status: AC
  Administered 2015-03-27: 800 mg via ORAL
  Filled 2015-03-27: qty 1

## 2015-03-27 MED ORDER — METHOCARBAMOL 500 MG PO TABS
1000.0000 mg | ORAL_TABLET | Freq: Once | ORAL | Status: AC
  Administered 2015-03-27: 1000 mg via ORAL
  Filled 2015-03-27: qty 2

## 2015-03-27 NOTE — ED Notes (Signed)
Returned from xray
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
## 2015-03-27 NOTE — ED Notes (Signed)
Report received 
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
## 2015-03-27 NOTE — ED Notes (Signed)
MD with pt  

## 2015-03-27 NOTE — Discharge Instructions (Signed)
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
## 2017-11-04 ENCOUNTER — Emergency Department (HOSPITAL_BASED_OUTPATIENT_CLINIC_OR_DEPARTMENT_OTHER): Payer: Self-pay

## 2017-11-04 ENCOUNTER — Encounter (HOSPITAL_BASED_OUTPATIENT_CLINIC_OR_DEPARTMENT_OTHER): Payer: Self-pay | Admitting: *Deleted

## 2017-11-04 ENCOUNTER — Emergency Department (HOSPITAL_BASED_OUTPATIENT_CLINIC_OR_DEPARTMENT_OTHER)
Admission: EM | Admit: 2017-11-04 | Discharge: 2017-11-04 | Disposition: A | Discharge status: Home / Self Care | Payer: Self-pay | Attending: Emergency Medicine | Admitting: Emergency Medicine

## 2017-11-04 ENCOUNTER — Other Ambulatory Visit: Payer: Self-pay

## 2017-11-04 DIAGNOSIS — M25572 Pain in left ankle and joints of left foot: Secondary | ICD-10-CM | POA: Insufficient documentation

## 2017-11-04 DIAGNOSIS — M25672 Stiffness of left ankle, not elsewhere classified: Secondary | ICD-10-CM | POA: Insufficient documentation

## 2017-11-04 DIAGNOSIS — R2242 Localized swelling, mass and lump, left lower limb: Secondary | ICD-10-CM | POA: Insufficient documentation

## 2017-11-04 MED ORDER — IBUPROFEN 600 MG PO TABS: 600.0000 mg | ORAL_TABLET | Freq: Four times a day (QID) | ORAL | 0 refills | Status: DC | PRN

## 2017-11-04 NOTE — ED Notes (Signed)
inj to left ankle last week playing basketball  Increased swelling

## 2017-11-04 NOTE — ED Notes (Signed)
Ace wrap to applied to left ankle per PA. Ankle too large for ASO.

## 2017-11-04 NOTE — ED Triage Notes (Signed)
Left ankle injury last week. He twisted it during a basketball game.

## 2017-11-04 NOTE — ED Provider Notes (Signed)
[facility] EMERGENCY DEPARTMENT Provider Note   CSN: [id number] Arrival date & time: 11/04/17  2129     History   Chief Complaint Chief Complaint  Patient presents with  . Ankle Injury    HPI Jay Huerta is a 32 y.o. male presents today with chief complaint acute onset, progressively improving left ankle pain after injury 1 week ago.  He was playing basketball and stepped on another player, inverting his ankle.  Denies head injury or loss of consciousness.  Endorses immediate onset of throbbing pain and swelling.  Pain occasionally radiates to the lateral aspect of the left lower extremity.  Pain worsens with movement and ambulation.  He is able to ambulate although it is painful.  He went to a "pro bono"clinic the day after the injury and the provider there did not think that the ankle was fractured and so did not image it.  He has been using ibuprofen with some relief of his symptoms.  Is also been applying ice and elevating.  No fevers or chills.  Denies numbness, tingling, or weakness.  The history is provided by the patient.    History reviewed. No pertinent past medical history.  There are no active problems to display for this patient.   Past Surgical History:  Procedure Laterality Date  . INGUINAL HERNIA REPAIR         Home Medications    Prior to Admission medications   Medication Sig Start Date End Date Taking? Authorizing Provider  ibuprofen (ADVIL,MOTRIN) 600 MG tablet Take 1 tablet (600 mg total) by mouth every 6 (six) hours as needed for moderate pain. 11/04/17   Baylei Siebels A, PA-C  methocarbamol (ROBAXIN) 500 MG tablet Take 1 tablet (500 mg total) by mouth 2 (two) times daily. 03/27/15   Palumbo, April, MD  ondansetron (ZOFRAN ODT) 8 MG disintegrating tablet Take 1 tablet (8 mg total) by mouth every 8 (eight) hours as needed for nausea or vomiting. 04/01/14   Marisa Severin, MD    Family History Family History  Problem Relation Age of Onset  .  Diabetes Other   . CAD Other   . Hypertension Other     Social History Social History   Tobacco Use  . Smoking status: Never Smoker  . Smokeless tobacco: Never Used  Substance Use Topics  . Alcohol use: Yes    Comment: occ  . Drug use: No     Allergies   Patient has no known allergies.   Review of Systems Review of Systems  Constitutional: Negative for chills and fever.  Musculoskeletal: Positive for arthralgias (Left ankle).  Neurological: Negative for syncope, weakness and numbness.     Physical Exam Updated Vital Signs BP (!) 152/84 (BP Location: Right Arm)   Pulse 85   Temp 98.7 F (37.1 C) (Oral)   Resp 17   Ht 6\' 5"  (1.956 m)   Wt 102.1 kg (225 lb)   SpO2 100%   BMI 26.68 kg/m   Physical Exam  Constitutional: He appears well-developed and well-nourished. No distress.  HENT:  Head: Normocephalic and atraumatic.  Eyes: Conjunctivae are normal. Right eye exhibits no discharge. Left eye exhibits no discharge.  Neck: No JVD present. No tracheal deviation present.  Cardiovascular: Normal rate and intact distal pulses.  2+ radial pulses bilaterally  Pulmonary/Chest: Effort normal.  Abdominal: He exhibits no distension.  Musculoskeletal: He exhibits edema and tenderness.       Right ankle: Normal.  Left ankle: He exhibits decreased range of motion and swelling. He exhibits no ecchymosis, no deformity, no laceration and normal pulse. Tenderness. Lateral malleolus and medial malleolus tenderness found. No AITFL, no CF ligament, no posterior TFL, no head of 5th metatarsal and no proximal fibula tenderness found. Achilles tendon normal.  5/5 strength of BLE major muscle groups.  No varus or valgus deformity or ligamentous laxity noted on examination of the left ankle.  Pain with inversion and eversion.  Neurological: He is alert.  Fluent speech, no facial droop, sensation intact to soft touch of bilateral lower extremity's.  Able to Heel Walk and Toe Walk  without difficulty.  Somewhat antalgic gait  Skin: Skin is warm and dry. No erythema.  Psychiatric: He has a normal mood and affect. His behavior is normal.  Nursing note and vitals reviewed.    ED Treatments / Results  Labs (all labs ordered are listed, but only abnormal results are displayed) Labs Reviewed - No data to display  EKG  EKG Interpretation None       Radiology Dg Ankle Complete Left  Result Date: 11/04/2017 CLINICAL DATA:  Left ankle pain after basketball injury 1 week prior. EXAM: LEFT ANKLE COMPLETE - 3+ VIEW COMPARISON:  None. FINDINGS: Diffuse left ankle soft tissue swelling. No definite acute fracture. No subluxation. A few tiny osseous fragments adjacent to the tips of the medial and lateral malleoli appear well corticated. No suspicious focal osseous lesions. No radiopaque foreign bodies. IMPRESSION: 1. Diffuse left ankle soft tissue swelling. 2. No definite acute fracture.  No subluxation. 3. Scattered tiny osseous fragments adjacent to the tips of the medial and lateral malleoli, which appear well corticated, suggesting the sequela of prior injury, with tiny acute avulsion fragments not entirely excluded. Electronically Signed   By: Delbert Phenix M.D.   On: 11/04/2017 21:58    Procedures Procedures (including critical care time)  Medications Ordered in ED Medications - No data to display   Initial Impression / Assessment and Plan / ED Course  I have reviewed the triage vital signs and the nursing notes.  Pertinent labs & imaging results that were available during my care of the patient were reviewed by me and considered in my medical decision making (see chart for details).     Patient with progressively improving left ankle pain after injury one week ago.  Afebrile, vital signs are stable.  Neurovascularly intact.  Ambulatory without difficulty although it is somewhat painful.  No evidence of Achilles tendon disruption.  Radiographs show soft tissue  swelling with no definite acute fracture or subluxation.  There are scattered osseous fragments which are well-corticated and likely correspond to prior injury but do not exclude acute avulsion fracture.  Patient does note that he had an extensive ankle injury in high school which eventually healed.  Suspect these bony fragments are related to this prior injury.  He has no clinically significant ankle fracture on examination and is ambulatory without difficulty.  Doubt septic joint, gout, or osteomyelitis. will discharge with Ace wrap and crutches.  He will follow-up with primary care physician orthopedics for reevaluation.  Discussed RICE therapy.  Discussed indications for return to the ED. Pt verbalized understanding of and agreement with plan and is safe for discharge home at this time.  He has no complaints prior to discharge.  Final Clinical Impressions(s) / ED Diagnoses   Final diagnoses:  Acute left ankle pain    ED Discharge Orders  Ordered    ibuprofen (ADVIL,MOTRIN) 600 MG tablet  Every 6 hours PRN     11/04/17 2325       Jeanie SewerFawze, Hargun Spurling A, PA-C 11/05/17 0112    Rolland PorterJames, Mark, MD 11/09/17 2009

## 2017-11-04 NOTE — ED Notes (Signed)
PT discharged to home NAD.  

## 2017-11-04 NOTE — Discharge Instructions (Signed)
1. Medications: Alternate 600 mg of ibuprofen and [phone] mg of Tylenol every 3 hours as needed for pain. Do not exceed 4000 mg of Tylenol daily. 2. Treatment: rest, ice, elevate and use brace, drink plenty of fluids, gentle stretching 3. Follow Up: Please followup with orthopedics as directed or your PCP in 1 week if no improvement for discussion of your diagnoses and further evaluation after today's visit; if you do not have a primary care doctor use the resource guide provided to find one; Please return to the ER for worsening symptoms or other concerns

## 2018-07-15 ENCOUNTER — Other Ambulatory Visit: Payer: Self-pay

## 2018-07-15 ENCOUNTER — Encounter (HOSPITAL_BASED_OUTPATIENT_CLINIC_OR_DEPARTMENT_OTHER): Payer: Self-pay

## 2018-07-15 ENCOUNTER — Emergency Department (HOSPITAL_BASED_OUTPATIENT_CLINIC_OR_DEPARTMENT_OTHER)
Admission: EM | Admit: 2018-07-15 | Discharge: 2018-07-16 | Disposition: A | Discharge status: Home / Self Care | Payer: Self-pay | Attending: Emergency Medicine | Admitting: Emergency Medicine

## 2018-07-15 DIAGNOSIS — Z79899 Other long term (current) drug therapy: Secondary | ICD-10-CM | POA: Insufficient documentation

## 2018-07-15 DIAGNOSIS — Y929 Unspecified place or not applicable: Secondary | ICD-10-CM | POA: Insufficient documentation

## 2018-07-15 DIAGNOSIS — W540XXA Bitten by dog, initial encounter: Secondary | ICD-10-CM | POA: Insufficient documentation

## 2018-07-15 DIAGNOSIS — Y939 Activity, unspecified: Secondary | ICD-10-CM | POA: Insufficient documentation

## 2018-07-15 DIAGNOSIS — Z23 Encounter for immunization: Secondary | ICD-10-CM | POA: Insufficient documentation

## 2018-07-15 DIAGNOSIS — S0185XA Open bite of other part of head, initial encounter: Secondary | ICD-10-CM

## 2018-07-15 DIAGNOSIS — S01531A Puncture wound without foreign body of lip, initial encounter: Secondary | ICD-10-CM | POA: Insufficient documentation

## 2018-07-15 DIAGNOSIS — Y999 Unspecified external cause status: Secondary | ICD-10-CM | POA: Insufficient documentation

## 2018-07-15 NOTE — ED Triage Notes (Signed)
Dog bite to bottom lip from girlfriend's dog, 1cm gash to bottom left corner, dog is UTD on vaccinations

## 2018-07-16 ENCOUNTER — Encounter (HOSPITAL_BASED_OUTPATIENT_CLINIC_OR_DEPARTMENT_OTHER): Payer: Self-pay | Admitting: Emergency Medicine

## 2018-07-16 MED ORDER — AMOXICILLIN-POT CLAVULANATE 875-125 MG PO TABS
1.0000 | ORAL_TABLET | Freq: Once | ORAL | Status: AC
  Administered 2018-07-16: 1 via ORAL
  Filled 2018-07-16: qty 1

## 2018-07-16 MED ORDER — IBUPROFEN 800 MG PO TABS
800.0000 mg | ORAL_TABLET | Freq: Once | ORAL | Status: AC
  Administered 2018-07-16: 800 mg via ORAL
  Filled 2018-07-16: qty 1

## 2018-07-16 MED ORDER — TETANUS-DIPHTH-ACELL PERTUSSIS 5-2.5-18.5 LF-MCG/0.5 IM SUSP
0.5000 mL | Freq: Once | INTRAMUSCULAR | Status: AC
  Administered 2018-07-16: 0.5 mL via INTRAMUSCULAR
  Filled 2018-07-16: qty 0.5

## 2018-07-16 MED ORDER — IBUPROFEN 800 MG PO TABS: 800.0000 mg | ORAL_TABLET | Freq: Three times a day (TID) | ORAL | 0 refills | Status: DC

## 2018-07-16 MED ORDER — AMOXICILLIN-POT CLAVULANATE 875-125 MG PO TABS: 1.0000 | ORAL_TABLET | Freq: Two times a day (BID) | ORAL | 0 refills | Status: DC

## 2018-07-16 MED FILL — IBUPROFEN 800 MG TAB: 800 | 7 days supply | Qty: 21 | Fill #0

## 2018-07-16 NOTE — ED Provider Notes (Signed)
[facility] EMERGENCY DEPARTMENT Provider Note   CSN: [id number] Arrival date & time: 07/15/18  2327     History   Chief Complaint Chief Complaint  Patient presents with  . Animal Bite    HPI Jay Huerta is a 32 y.o. male.  The history is provided by the patient.  Animal Bite  Contact animal:  Dog Location:  Mouth Mouth injury location:  Lower outer lip Pain details:    Quality:  Aching   Severity:  Moderate   Timing:  Constant   Progression:  Unchanged Incident location:  Home Provoked: unprovoked   Notifications:  None Animal's rabies vaccination status:  Up to date Animal in possession: yes   Tetanus status:  Unknown Relieved by:  Nothing Worsened by:  Nothing Ineffective treatments:  None tried Associated symptoms: no fever     History reviewed. No pertinent past medical history.  There are no active problems to display for this patient.   Past Surgical History:  Procedure Laterality Date  . INGUINAL HERNIA REPAIR          Home Medications    Prior to Admission medications   Medication Sig Start Date End Date Taking? Authorizing Provider  amoxicillin-clavulanate (AUGMENTIN) 875-125 MG tablet Take 1 tablet by mouth 2 (two) times daily. One po bid x 7 days 07/16/18   Lalisa Kiehn, MD  ibuprofen (ADVIL,MOTRIN) 600 MG tablet Take 1 tablet (600 mg total) by mouth every 6 (six) hours as needed for moderate pain. 11/04/17   Fawze, Mina A, PA-C  ibuprofen (ADVIL,MOTRIN) 800 MG tablet Take 1 tablet (800 mg total) by mouth 3 (three) times daily. 07/16/18   Jshaun Abernathy, MD  methocarbamol (ROBAXIN) 500 MG tablet Take 1 tablet (500 mg total) by mouth 2 (two) times daily. 03/27/15   Mindel Friscia, MD  ondansetron (ZOFRAN ODT) 8 MG disintegrating tablet Take 1 tablet (8 mg total) by mouth every 8 (eight) hours as needed for nausea or vomiting. 04/01/14   Marisa Severin, MD    Family History Family History  Problem Relation Age of Onset  . Diabetes  Other   . CAD Other   . Hypertension Other     Social History Social History   Tobacco Use  . Smoking status: Never Smoker  . Smokeless tobacco: Never Used  Substance Use Topics  . Alcohol use: Yes    Comment: occ  . Drug use: No     Allergies   Patient has no known allergies.   Review of Systems Review of Systems  Constitutional: Negative for fever.  Respiratory: Negative for shortness of breath.   Cardiovascular: Negative for chest pain.  Skin: Positive for wound.  All other systems reviewed and are negative.    Physical Exam Updated Vital Signs BP (!) 131/94 (BP Location: Right Arm)   Pulse 60   Temp 98.6 F (37 C)   Resp 20   Ht 6\' 5"  (1.956 m)   Wt 102.1 kg   SpO2 99%   BMI 26.68 kg/m   Physical Exam  Constitutional: He is oriented to person, place, and time. He appears well-developed and well-nourished. No distress.  HENT:  Head: Normocephalic and atraumatic.    Nose: Nose normal.  Eyes: Pupils are equal, round, and reactive to light. Conjunctivae are normal.  Neck: Normal range of motion. Neck supple.  Cardiovascular: Normal rate, regular rhythm, normal heart sounds and intact distal pulses.  Pulmonary/Chest: Effort normal and breath sounds normal. No stridor. He has  no wheezes. He has no rales.  Abdominal: Soft. Bowel sounds are normal. He exhibits no mass. There is no tenderness. There is no rebound and no guarding.  Musculoskeletal: Normal range of motion.  Neurological: He is alert and oriented to person, place, and time.  Skin: Skin is warm and dry. Capillary refill takes less than 2 seconds.  Psychiatric: He has a normal mood and affect.  Nursing note and vitals reviewed.    ED Treatments / Results  Labs (all labs ordered are listed, but only abnormal results are displayed) Labs Reviewed - No data to display  EKG None  Radiology No results found.  Procedures Procedures (including critical care time)  Medications Ordered in  ED Medications  ibuprofen (ADVIL,MOTRIN) tablet 800 mg (800 mg Oral Given 07/16/18 0008)  amoxicillin-clavulanate (AUGMENTIN) 875-125 MG per tablet 1 tablet (1 tablet Oral Given 07/16/18 0008)  Tdap (BOOSTRIX) injection 0.5 mL (0.5 mLs Intramuscular Given 07/16/18 0009)    Irrigated copious with sterile saline.  Given nature of the wound will not close as is likely to get infected will start antibiotics and have patient follow up with facial trauma for secondary closure.      Final Clinical Impressions(s) / ED Diagnoses   Final diagnoses:  Dog bite of face, initial encounter    Return for weakness, numbness, changes in vision or speech, fevers >100.4 unrelieved by medication, shortness of breath, intractable vomiting, or diarrhea, abdominal pain, Inability to tolerate liquids or food, cough, altered mental status or any concerns. No signs of systemic illness or infection. The patient is nontoxic-appearing on exam and vital signs are within normal limits.    I have reviewed the triage vital signs and the nursing notes. Pertinent labs &imaging results that were available during my care of the patient were reviewed by me and considered in my medical decision making (see chart for details).  After history, exam, and medical workup I feel the patient has been appropriately medically screened and is safe for discharge home. Pertinent diagnoses were discussed with the patient. Patient was given return precautions.    ED Discharge Orders         Ordered    amoxicillin-clavulanate (AUGMENTIN) 875-125 MG tablet  2 times daily     07/16/18 0003    ibuprofen (ADVIL,MOTRIN) 800 MG tablet  3 times daily     07/16/18 0003           Shatoria Stooksbury, MD 07/16/18 1610

## 2019-03-30 DIAGNOSIS — R030 Elevated blood-pressure reading, without diagnosis of hypertension: Secondary | ICD-10-CM | POA: Insufficient documentation

## 2019-03-30 DIAGNOSIS — M4180 Other forms of scoliosis, site unspecified: Secondary | ICD-10-CM | POA: Insufficient documentation

## 2019-08-09 ENCOUNTER — Other Ambulatory Visit: Payer: Self-pay

## 2019-08-09 DIAGNOSIS — Z20822 Contact with and (suspected) exposure to covid-19: Secondary | ICD-10-CM

## 2019-08-11 LAB — NOVEL CORONAVIRUS, NAA: SARS-CoV-2, NAA: NOT DETECTED

## 2023-01-29 ENCOUNTER — Other Ambulatory Visit: Payer: Self-pay | Admitting: Family Medicine

## 2023-01-29 DIAGNOSIS — E049 Nontoxic goiter, unspecified: Secondary | ICD-10-CM

## 2023-02-03 ENCOUNTER — Ambulatory Visit
Admission: RE | Admit: 2023-02-03 | Discharge: 2023-02-03 | Disposition: A | Discharge status: Home / Self Care | Payer: Medicaid Other | Source: Ambulatory Visit | Attending: Family Medicine | Admitting: Family Medicine

## 2023-02-03 DIAGNOSIS — E049 Nontoxic goiter, unspecified: Secondary | ICD-10-CM

## 2023-02-19 NOTE — Therapy (Signed)
OUTPATIENT PHYSICAL THERAPY THORACOLUMBAR EVALUATION   Patient Name: Jay Huerta MRN: [id number] DOB:November 15, 1985, 37 y.o., male Today's Date: 02/21/2023  END OF SESSION:  PT End of Session - 02/21/23 1315     Visit Number 1    Number of Visits 17    Date for PT Re-Evaluation 04/18/23    Authorization Type [employer]    Authorization Time Period auth TBD    PT Start Time 1316    PT Stop Time 1358    PT Time Calculation (min) 42 min    Activity Tolerance Patient tolerated treatment well;No increased pain    Behavior During Therapy [facility] for tasks assessed/performed             History reviewed. No pertinent past medical history. Past Surgical History:  Procedure Laterality Date   INGUINAL HERNIA REPAIR     There are no problems to display for this patient.   PCP: no PCP in chart  REFERRING PROVIDER: Bedelia Person, MD  REFERRING DIAG: M54.9 (ICD-10-CM) - Dorsalgia, unspecified  Rationale for Evaluation and Treatment: Rehabilitation  THERAPY DIAG:  Other low back pain  Stiffness of left hip, not elsewhere classified  Stiffness of right hip, not elsewhere classified  ONSET DATE: since college  SUBJECTIVE:                                                                                                                                                                                           SUBJECTIVE STATEMENT: Pt describes chronic low back and hip pain since college, does not recall any specific injury although mentions he may have strained it, did have a few stress fractures in his feet. States symptoms have fluctuated, had a trainer a few years back that gave him exercises which improved things. Also notes that he had a few visits with a chiropractor who informed him that his pain was due to "one side of his back not working correctly" and having a leg length discrepancy. Pt remains fairly active, plays basketball in a league 2x/week which tends to give  him increased pain afterwards. Also has increased pain with prolonged positioning (sitting, driving, etc).  Denies LE referral, N/T. Red flag questioning reassuring    PERTINENT HISTORY:  No PMH in chart   PAIN:  Are you having pain: none Location/description: throbbing in tailbone and belt line Best-worst over past week: 0-8/10  - aggravating factors: heavier activity, basketball, prolonged sitting >1hr  - Easing factors: massage, heat, stretching  PRECAUTIONS: None  WEIGHT BEARING RESTRICTIONS: No  FALLS:  Has patient fallen in last 6 months? 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   PLOF: Independent - active, enjoys sports  PATIENT GOALS: be able to manage pain better, get back and hips loosened up  NEXT MD VISIT: July   OBJECTIVE:   DIAGNOSTIC FINDINGS:  Per review of paper referral (see media tab) no acute findings on lumbar XR   PATIENT SURVEYS:  Modified Oswestry: 19/50 raw score, 38%  SCREENING FOR RED FLAGS: Red flag questioning/screening reassuring    COGNITION: Overall cognitive status: Within functional limits for tasks assessed     SENSATION/NEURO: Light touch intact all extremities   No clonus either LE   POSTURE: significantly rounded shoulders, hyperlordotic lumbar spine in sitting, tendency for increased kyphosis sitting  PALPATION: Tenderness to palpation BIL thoracolumbar paraspinals more tender approaching SIJ, most tender at distal QL attachments BIL and BIL glute/piriformis  LUMBAR ROM:   AROM eval  Flexion Mid shin *  Extension 75% *   Right lateral flexion Knee joint *  Left lateral flexion Knee joint *   Right rotation 100% *  Left rotation 100% *    (Blank rows = not tested) (Key: WFL = within functional limits not formally assessed, * = concordant pain, s = stiffness/stretching sensation, NT = not tested)   LOWER EXTREMITY ROM:      Passive  Right eval Left eval  Hip flexion    Hip extension    Hip internal rotation    Hip external rotation    Knee extension    Knee flexion    (Blank rows = not tested) (Key: WFL = within functional limits not formally assessed, * = concordant pain, s = stiffness/stretching sensation, NT = not tested)  Comments: see hip testing below  LOWER EXTREMITY MMT:    MMT Right eval Left eval  Hip flexion 4+ groin pain 4+  Hip abduction (modified sitting) 5 5  Hip internal rotation 4+ mild groin pain and lateral hip pain 5  Hip external rotation 4+ 5  Knee flexion 5 5  Knee extension 5 5  Ankle dorsiflexion 5 5   (Blank rows = not tested) (Key: WFL = within functional limits not formally assessed, * = concordant pain, s = stiffness/stretching sensation, NT = not tested)  Comments:    LUMBAR TESTS: Prone trunk extension repeated: elicits concordant symptoms non worsening over 10 repetitions   HIP TESTS: FABER + BIL FADDIR + R negative L Significant relief with hip distraction BIL Initial discomfort with PA SIJ mobilization but also describes as relieving   GAIT: Distance walked: within clinic Assistive device utilized: None Level of assistance: Complete Independence Comments: mechanics grossly WNL  TODAY'S TREATMENT:                                                                                                                              [facility] Adult PT Treatment:  DATE: 02/21/23 Therapeutic Exercise: Prone superman x10 Deferred further HEP given time constraints HEP handout + education   PATIENT EDUCATION:  Education details: Pt education on PT impairments, prognosis, and POC. Informed consent. Rationale for interventions, safe/appropriate HEP performance Person educated: Patient Education method: Explanation, Demonstration, Tactile cues, Verbal cues, and Handouts Education comprehension: verbalized understanding,  returned demonstration, verbal cues required, tactile cues required, and needs further education    HOME EXERCISE PROGRAM: Access Code: [email or web address] URL: [email or web address] Date: 02/21/2023 Prepared by: Fransisco Hertz  Exercises - Prone Press Up On Elbows  - 1 x daily - 7 x weekly - 2-3 sets - 10 reps  ASSESSMENT:  CLINICAL IMPRESSION: Patient is a pleasant 37 y.o. gentleman who was seen today for physical therapy evaluation and treatment for low back/hip pain that has been going on for several years. Pt states he is able to perform typical activities with increased pain, particular aggravation of symptoms with prolonged positioning or with higher level activities such as basketball/weightlifting. On exam, pt demos concordant pain with lumbar ROM in all directions, as well as positive results with hip provocation testing. Good relief from SIJ PA mobs and hip distraction BIL. Also demonstrates concordant symptoms (non worsening) with repeated prone trunk extension which was prescribed as HEP to begin to work on postural endurance. Pt tolerates session well overall and departs without any reported increase in pain. Recommend skilled PT to address postural endurance, hip/lumbar mobility and strength in order to improve tolerance to daily activities and sport/recreational activities in order to promote overall health and QOL. No adverse events, pt verbalizes agreement with plan as below. Pt departs today's session in no acute distress, all voiced questions/concerns addressed appropriately from PT perspective.    OBJECTIVE IMPAIRMENTS: decreased activity tolerance, decreased endurance, decreased mobility, decreased ROM, decreased strength, hypomobility, impaired perceived functional ability, postural dysfunction, and pain.   ACTIVITY LIMITATIONS: carrying, lifting, sitting, and standing  PARTICIPATION LIMITATIONS: driving and community activity  PERSONAL FACTORS: Time since onset of  injury/illness/exacerbation are also affecting patient's functional outcome.   REHAB POTENTIAL: Good  CLINICAL DECISION MAKING: Stable/uncomplicated  EVALUATION COMPLEXITY: Low   GOALS: Goals reviewed with patient? No given time constraints  SHORT TERM GOALS: Target date: 03/21/2023 Pt will demonstrate appropriate understanding and performance of initially prescribed HEP in order to facilitate improved independence with management of symptoms.  Baseline: HEP provided on eval Goal status: INITIAL   2. Pt will score less than or equal to 27% on Modified Oswestry in order to demonstrate improved perception of function due to symptoms.  Baseline: 38%  Goal status: INITIAL    LONG TERM GOALS: Target date: 04/18/2023 Pt will score  less than or equal to 19% on Modified Oswestry in order to demonstrate improved perception of functional status due to symptoms.  Baseline: 38% Goal status: INITIAL  2.  Pt will demonstrate symmetrical and painless lumbar rotation AROM in order to demonstrate improved tolerance to functional movement patterns.  Baseline: see ROM chart above Goal status: INITIAL  3.  Pt will demonstrate hip ER/IR MMT of 5/5 BIL without pain in order to demonstrate improved strength for functional movements.  Baseline: see MMT chart above Goal status: INITIAL  4. Pt will perform 20 repetitions of repeated trunk extensions in prone without reproduction of pain in order to facilitate improved postural endurance for sport activities.  Baseline: 10 repetitions with consistent concordant symptoms, non worsening  Goal status: INITIAL   5. Pt will report at least 50% decrease in  overall pain levels in past week in order to facilitate improved tolerance to basic ADLs/mobility.   Baseline: 0-8/10 in past week  Goal status: INITIAL    6. 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   Baseline: up to 8/10 at worst, pain increases with 1 hr sitting   Goal status: INITIAL   PLAN:  PT FREQUENCY: 2x/week   PT DURATION: 8 weeks  PLANNED INTERVENTIONS: Therapeutic exercises, Therapeutic activity, Neuromuscular re-education, Balance training, Gait training, Patient/Family education, Self Care, Joint mobilization, Joint manipulation, Stair training, Aquatic Therapy, Dry Needling, Electrical stimulation, Spinal manipulation, Spinal mobilization, Cryotherapy, Moist heat, Taping, Traction, Manual therapy, and Re-evaluation.  PLAN FOR NEXT SESSION: review/update HEP PRN. Would likely benefit from trunk extensor endurance exercises, hip mobility and rotational strength. Manual PRN as indicate, pt interested in spinal manipulation, hip mobilization   Ashley Murrain PT, DPT 02/21/2023 3:47 PM    Check all possible CPT codes: 16109 - PT Re-evaluation, 97110- Therapeutic Exercise, [phone]- Neuro Re-education, [phone] - Gait Training, [phone] - Manual Therapy, [phone] - Therapeutic Activities, [phone] - Self Care, [phone] - Physical performance training, and [phone] - Aquatic therapy    Check all conditions that are expected to impact treatment: {Conditions expected to impact treatment:Musculoskeletal disorders   If treatment provided at initial evaluation, no treatment charged due to lack of authorization.

## 2023-02-21 ENCOUNTER — Other Ambulatory Visit: Payer: Self-pay

## 2023-02-21 ENCOUNTER — Encounter: Payer: Self-pay | Admitting: Physical Therapy

## 2023-02-21 ENCOUNTER — Ambulatory Visit: Payer: Medicaid Other | Attending: Neurosurgery | Admitting: Physical Therapy

## 2023-02-21 DIAGNOSIS — M5459 Other low back pain: Secondary | ICD-10-CM | POA: Insufficient documentation

## 2023-02-21 DIAGNOSIS — M25652 Stiffness of left hip, not elsewhere classified: Secondary | ICD-10-CM | POA: Insufficient documentation

## 2023-02-21 DIAGNOSIS — M25651 Stiffness of right hip, not elsewhere classified: Secondary | ICD-10-CM | POA: Insufficient documentation

## 2023-03-11 ENCOUNTER — Ambulatory Visit: Payer: Medicaid Other

## 2023-03-13 ENCOUNTER — Ambulatory Visit: Payer: Medicaid Other

## 2023-03-18 ENCOUNTER — Ambulatory Visit: Payer: Medicaid Other

## 2023-03-18 DIAGNOSIS — M25652 Stiffness of left hip, not elsewhere classified: Secondary | ICD-10-CM

## 2023-03-18 DIAGNOSIS — M5459 Other low back pain: Secondary | ICD-10-CM

## 2023-03-18 DIAGNOSIS — M25651 Stiffness of right hip, not elsewhere classified: Secondary | ICD-10-CM

## 2023-03-18 NOTE — Therapy (Signed)
OUTPATIENT PHYSICAL THERAPY TREATMENT NOTE   Patient Name: Jay Huerta MRN: [id number] DOB:[birth year], 37 y.o., male Today's Date: 03/18/2023  END OF SESSION:    No past medical history on file. Past Surgical History:  Procedure Laterality Date   INGUINAL HERNIA REPAIR     There are no problems to display for this patient.   PCP: no PCP in chart  REFERRING PROVIDER: Bedelia Person, MD  REFERRING DIAG: M54.9 (ICD-10-CM) - Dorsalgia, unspecified  Rationale for Evaluation and Treatment: Rehabilitation  THERAPY DIAG:  No diagnosis found.  ONSET DATE: since college  SUBJECTIVE:                                                                                                                                                                                           SUBJECTIVE STATEMENT: Patient reports that he had a stretching session this morning and feeling a bit better in his lower back. He reports that he primarily plays basketball and stretches, but has not been including any strength training in his normal exercises habits.     PERTINENT HISTORY:  No PMH in chart   PAIN:  Are you having pain: none Location/description: throbbing in tailbone and belt line Pain: 1/10  - aggravating factors: heavier activity, basketball, prolonged sitting >1hr  - Easing factors: massage, heat, stretching  PRECAUTIONS: None  WEIGHT BEARING RESTRICTIONS: No  FALLS:  Has patient fallen in last 6 months? 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   PLOF: Independent - active, enjoys sports  PATIENT GOALS: be able to manage pain better, get back and hips loosened up  NEXT MD VISIT: July   OBJECTIVE:   DIAGNOSTIC FINDINGS:  Per review of paper referral (see media tab) no acute findings on lumbar XR   PATIENT SURVEYS:  Modified Oswestry: 19/50 raw score, 38%  SCREENING FOR RED  FLAGS: Red flag questioning/screening reassuring    COGNITION: Overall cognitive status: Within functional limits for tasks assessed     SENSATION/NEURO: Light touch intact all extremities   No clonus either LE   POSTURE: significantly rounded shoulders, hyperlordotic lumbar spine in sitting, tendency for increased kyphosis sitting  PALPATION: Tenderness to palpation BIL thoracolumbar paraspinals more tender approaching SIJ, most tender at distal QL attachments BIL and BIL glute/piriformis  LUMBAR ROM:   AROM eval  Flexion Mid shin *  Extension 75% *   Right lateral flexion Knee joint *  Left lateral flexion Knee joint *   Right rotation 100% *  Left rotation 100% *    (  Blank rows = not tested) (Key: WFL = within functional limits not formally assessed, * = concordant pain, s = stiffness/stretching sensation, NT = not tested)   LOWER EXTREMITY ROM:     Passive  Right eval Left eval  Hip flexion    Hip extension    Hip internal rotation    Hip external rotation    Knee extension    Knee flexion    (Blank rows = not tested) (Key: WFL = within functional limits not formally assessed, * = concordant pain, s = stiffness/stretching sensation, NT = not tested)  Comments: see hip testing below  LOWER EXTREMITY MMT:    MMT Right eval Left eval  Hip flexion 4+ groin pain 4+  Hip abduction (modified sitting) 5 5  Hip internal rotation 4+ mild groin pain and lateral hip pain 5  Hip external rotation 4+ 5  Knee flexion 5 5  Knee extension 5 5  Ankle dorsiflexion 5 5   (Blank rows = not tested) (Key: WFL = within functional limits not formally assessed, * = concordant pain, s = stiffness/stretching sensation, NT = not tested)  Comments:    LUMBAR TESTS: Prone trunk extension repeated: elicits concordant symptoms non worsening over 10 repetitions   HIP TESTS: FABER + BIL FADDIR + R negative L Significant relief with hip distraction BIL Initial discomfort with PA  SIJ mobilization but also describes as relieving   GAIT: Distance walked: within clinic Assistive device utilized: None Level of assistance: Complete Independence Comments: mechanics grossly WNL  TODAY'S TREATMENT:      [facility] Adult PT Treatment:                                                DATE: 03/18/2023  Therapeutic Exercise: Seated 3-way flexion ball roll for lumbar warm up, x 5 each 90/90 heel taps with blue band, x10, x15 Side planks, 2 x 10  Glute bridges with resisted hip abduction, blue TB, 2 x 10  Sidelying hip adduction, 2 x 10, modified R hip adduction d/t groin pain and performing  Prone Superman, 3 x 5, 3 sec holds 1st set, 5 sec holds 2nd set Standing Chops on foam with cc #13 x15 each side, #17 x 15 each side                                                                                                                             [facility] Adult PT Treatment:                                                DATE: 02/21/23 Therapeutic Exercise: Prone superman x10 Deferred further HEP given time constraints HEP  handout + education   PATIENT EDUCATION:  Education details: Pt education on PT impairments, prognosis, and POC. Informed consent. Rationale for interventions, safe/appropriate HEP performance Person educated: Patient Education method: Explanation, Demonstration, Tactile cues, Verbal cues, and Handouts Education comprehension: verbalized understanding, returned demonstration, verbal cues required, tactile cues required, and needs further education    HOME EXERCISE PROGRAM: Access Code: [email or web address] URL: [email or web address] Date: 02/21/2023 Prepared by: Fransisco Hertz  Exercises - Prone Press Up On Elbows  - 1 x daily - 7 x weekly - 2-3 sets - 10 reps  ASSESSMENT:  CLINICAL IMPRESSION: Patient was able to tolerate initial core and hip strengthening program today. He was limited with sidelying hip adduction due to groin pain and side planks due to  what he reports as L RTC pain. We will continue to progress as tolerated.    OBJECTIVE IMPAIRMENTS: decreased activity tolerance, decreased endurance, decreased mobility, decreased ROM, decreased strength, hypomobility, impaired perceived functional ability, postural dysfunction, and pain.   ACTIVITY LIMITATIONS: carrying, lifting, sitting, and standing  PARTICIPATION LIMITATIONS: driving and community activity  PERSONAL FACTORS: Time since onset of injury/illness/exacerbation are also affecting patient's functional outcome.   REHAB POTENTIAL: Good  CLINICAL DECISION MAKING: Stable/uncomplicated  EVALUATION COMPLEXITY: Low   GOALS: Goals reviewed with patient? No given time constraints  SHORT TERM GOALS: Target date: 03/21/2023 Pt will demonstrate appropriate understanding and performance of initially prescribed HEP in order to facilitate improved independence with management of symptoms.  Baseline: HEP provided on eval Goal status: INITIAL   2. Pt will score less than or equal to 27% on Modified Oswestry in order to demonstrate improved perception of function due to symptoms.  Baseline: 38%  Goal status: INITIAL    LONG TERM GOALS: Target date: 04/18/2023 Pt will score  less than or equal to 19% on Modified Oswestry in order to demonstrate improved perception of functional status due to symptoms.  Baseline: 38% Goal status: INITIAL  2.  Pt will demonstrate symmetrical and painless lumbar rotation AROM in order to demonstrate improved tolerance to functional movement patterns.  Baseline: see ROM chart above Goal status: INITIAL  3.  Pt will demonstrate hip ER/IR MMT of 5/5 BIL without pain in order to demonstrate improved strength for functional movements.  Baseline: see MMT chart above Goal status: INITIAL  4. Pt will perform 20 repetitions of repeated trunk extensions in prone without reproduction of pain in order to facilitate improved postural endurance for sport  activities.  Baseline: 10 repetitions with consistent concordant symptoms, non worsening  Goal status: INITIAL   5. Pt will report at least 50% decrease in overall pain levels in past week in order to facilitate improved tolerance to basic ADLs/mobility.   Baseline: 0-8/10 in past week  Goal status: INITIAL    6. 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   Baseline: up to 8/10 at worst, pain increases with 1 hr sitting   Goal status: INITIAL   PLAN:  PT FREQUENCY: 2x/week   PT DURATION: 8 weeks  PLANNED INTERVENTIONS: Therapeutic exercises, Therapeutic activity, Neuromuscular re-education, Balance training, Gait training, Patient/Family education, Self Care, Joint mobilization, Joint manipulation, Stair training, Aquatic Therapy, Dry Needling, Electrical stimulation, Spinal manipulation, Spinal mobilization, Cryotherapy, Moist heat, Taping, Traction, Manual therapy, and Re-evaluation.  PLAN FOR NEXT SESSION: review/update HEP PRN. Would likely benefit from trunk extensor endurance  exercises, hip mobility and rotational strength. Manual PRN as indicate, pt interested in spinal manipulation, hip mobilization   Mauri Reading, PT, DPT  03/18/2023 4:55 PM    Check all possible CPT codes: 16109 - PT Re-evaluation, 97110- Therapeutic Exercise, [phone]- Neuro Re-education, [phone] - Gait Training, [phone] - Manual Therapy, [phone] - Therapeutic Activities, [phone] - Self Care, [phone] - Physical performance training, and [phone] - Aquatic therapy    Check all conditions that are expected to impact treatment: {Conditions expected to impact treatment:Musculoskeletal disorders   If treatment provided at initial evaluation, no treatment charged due to lack of authorization.

## 2023-03-20 ENCOUNTER — Ambulatory Visit: Payer: Medicaid Other

## 2023-03-20 DIAGNOSIS — M25652 Stiffness of left hip, not elsewhere classified: Secondary | ICD-10-CM

## 2023-03-20 DIAGNOSIS — M5459 Other low back pain: Secondary | ICD-10-CM

## 2023-03-20 DIAGNOSIS — M25651 Stiffness of right hip, not elsewhere classified: Secondary | ICD-10-CM

## 2023-03-20 NOTE — Therapy (Signed)
OUTPATIENT PHYSICAL THERAPY TREATMENT NOTE   Patient Name: Jay Huerta MRN: [id number] DOB:[birth year], 37 y.o., male Today's Date: 03/20/2023  END OF SESSION:  PT End of Session - 03/20/23 1216     Visit Number 3    Number of Visits 17    Date for PT Re-Evaluation 04/18/23    Authorization Type [employer] blue    PT Start Time 1217    PT Stop Time 1257    PT Time Calculation (min) 40 min    Activity Tolerance Patient tolerated treatment well;No increased pain    Behavior During Therapy [facility] for tasks assessed/performed              No past medical history on file. Past Surgical History:  Procedure Laterality Date   INGUINAL HERNIA REPAIR     There are no problems to display for this patient.   PCP: no PCP in chart  REFERRING PROVIDER: Bedelia Person, MD  REFERRING DIAG: M54.9 (ICD-10-CM) - Dorsalgia, unspecified  Rationale for Evaluation and Treatment: Rehabilitation  THERAPY DIAG:  Stiffness of right hip, not elsewhere classified  Stiffness of left hip, not elsewhere classified  Other low back pain  ONSET DATE: since college  SUBJECTIVE:                                                                                                                                                                                           SUBJECTIVE STATEMENT: Patient reports that he had a stretching session this morning and feeling a bit better in his lower back. He reports that he primarily plays basketball and stretches, but has not been including any strength training in his normal exercises habits.    PERTINENT HISTORY:  No PMH in chart   PAIN:  Are you having pain: none Location/description: throbbing in tailbone and belt line Pain: 1/10  - aggravating factors: heavier activity, basketball, prolonged sitting >1hr  - Easing factors: massage, heat, stretching  PRECAUTIONS: None  WEIGHT BEARING RESTRICTIONS: No  FALLS:  Has patient fallen in last 6  months? 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   PLOF: Independent - active, enjoys sports  PATIENT GOALS: be able to manage pain better, get back and hips loosened up  NEXT MD VISIT: July   OBJECTIVE:   DIAGNOSTIC FINDINGS:  Per review of paper referral (see media tab) no acute findings on lumbar XR   PATIENT SURVEYS:  Modified Oswestry: 19/50 raw score, 38%  SCREENING FOR RED FLAGS: Red flag questioning/screening reassuring    COGNITION:  Overall cognitive status: Within functional limits for tasks assessed     SENSATION/NEURO: Light touch intact all extremities   No clonus either LE   POSTURE: significantly rounded shoulders, hyperlordotic lumbar spine in sitting, tendency for increased kyphosis sitting  PALPATION: Tenderness to palpation BIL thoracolumbar paraspinals more tender approaching SIJ, most tender at distal QL attachments BIL and BIL glute/piriformis  LUMBAR ROM:   AROM eval  Flexion Mid shin *  Extension 75% *   Right lateral flexion Knee joint *  Left lateral flexion Knee joint *   Right rotation 100% *  Left rotation 100% *    (Blank rows = not tested) (Key: WFL = within functional limits not formally assessed, * = concordant pain, s = stiffness/stretching sensation, NT = not tested)   LOWER EXTREMITY ROM:     Passive  Right eval Left eval  Hip flexion    Hip extension    Hip internal rotation    Hip external rotation    Knee extension    Knee flexion    (Blank rows = not tested) (Key: WFL = within functional limits not formally assessed, * = concordant pain, s = stiffness/stretching sensation, NT = not tested)  Comments: see hip testing below  LOWER EXTREMITY MMT:    MMT Right eval Left eval  Hip flexion 4+ groin pain 4+  Hip abduction (modified sitting) 5 5  Hip internal rotation 4+ mild groin pain and lateral hip pain 5  Hip external  rotation 4+ 5  Knee flexion 5 5  Knee extension 5 5  Ankle dorsiflexion 5 5   (Blank rows = not tested) (Key: WFL = within functional limits not formally assessed, * = concordant pain, s = stiffness/stretching sensation, NT = not tested)  Comments:    LUMBAR TESTS: Prone trunk extension repeated: elicits concordant symptoms non worsening over 10 repetitions   HIP TESTS: FABER + BIL FADDIR + R negative L Significant relief with hip distraction BIL Initial discomfort with PA SIJ mobilization but also describes as relieving   GAIT: Distance walked: within clinic Assistive device utilized: None Level of assistance: Complete Independence Comments: mechanics grossly WNL  TODAY'S TREATMENT:      [facility] Adult PT Treatment:                                                DATE: 03/20/2023  Therapeutic Exercise: 90/90 heel taps with 5#, 2 x 10 Side planks, 2 x 15 Glute bridges x 10, with blue TB for hip abduction 2 x 10 Sidelying hip abduction 5#, 2 x 15  Prone Superman, 3 x 5, 3 sec holds 1st set, 5 sec holds 2nd set Seated ball squeeze, 5 sec x 15  Standing Chops on foam with cc #17 x 15 each side  Pallof press with cable machine, #20 x 15 each side    [facility] Adult PT Treatment:                                                DATE: 03/18/2023  Therapeutic Exercise: Seated 3-way flexion ball roll for lumbar warm up, x 5 each 90/90 heel taps with blue band, x10, x15 Side planks, 2 x 10  Glute  bridges with resisted hip abduction, blue TB, 2 x 10  Sidelying hip adduction, 2 x 10, modified R hip adduction d/t groin pain and performing  Prone Superman, 3 x 5, 3 sec holds 1st set, 5 sec holds 2nd set Standing Chops on foam with cc #13 x15 each side, #17 x 15 each side                                                                                                                             [facility] Adult PT Treatment:                                                DATE: 02/21/23 Therapeutic  Exercise: Prone superman x10 Deferred further HEP given time constraints HEP handout + education   PATIENT EDUCATION:  Education details: Pt education on PT impairments, prognosis, and POC. Informed consent. Rationale for interventions, safe/appropriate HEP performance Person educated: Patient Education method: Explanation, Demonstration, Tactile cues, Verbal cues, and Handouts Education comprehension: verbalized understanding, returned demonstration, verbal cues required, tactile cues required, and needs further education    HOME EXERCISE PROGRAM: Access Code: [email or web address] URL: [email or web address] Date: 02/21/2023 Prepared by: Fransisco Hertz  Exercises - Prone Press Up On Elbows  - 1 x daily - 7 x weekly - 2-3 sets - 10 reps  ASSESSMENT:  CLINICAL IMPRESSION: Patient was able to tolerate progression of hip and core strengthening program today. He continues to have some limitations d/t groin pain. We will continue to progress as appropriate.   OBJECTIVE IMPAIRMENTS: decreased activity tolerance, decreased endurance, decreased mobility, decreased ROM, decreased strength, hypomobility, impaired perceived functional ability, postural dysfunction, and pain.   ACTIVITY LIMITATIONS: carrying, lifting, sitting, and standing  PARTICIPATION LIMITATIONS: driving and community activity  PERSONAL FACTORS: Time since onset of injury/illness/exacerbation are also affecting patient's functional outcome.   REHAB POTENTIAL: Good  CLINICAL DECISION MAKING: Stable/uncomplicated  EVALUATION COMPLEXITY: Low   GOALS: Goals reviewed with patient? No given time constraints  SHORT TERM GOALS: Target date: 03/21/2023 Pt will demonstrate appropriate understanding and performance of initially prescribed HEP in order to facilitate improved independence with management of symptoms.  Baseline: HEP provided on eval Goal status: INITIAL   2. Pt will score less than or equal to 27% on  Modified Oswestry in order to demonstrate improved perception of function due to symptoms.  Baseline: 38%  Goal status: INITIAL    LONG TERM GOALS: Target date: 04/18/2023 Pt will score  less than or equal to 19% on Modified Oswestry in order to demonstrate improved perception of functional status due to symptoms.  Baseline: 38% Goal status: INITIAL  2.  Pt will demonstrate symmetrical and painless lumbar rotation AROM in order to demonstrate improved tolerance to functional movement patterns.  Baseline: see ROM chart above Goal status: INITIAL  3.  Pt will demonstrate hip ER/IR MMT of 5/5 BIL without pain in order to demonstrate improved strength for functional movements.  Baseline: see MMT chart above Goal status: INITIAL  4. Pt will perform 20 repetitions of repeated trunk extensions in prone without reproduction of pain in order to facilitate improved postural endurance for sport activities.  Baseline: 10 repetitions with consistent concordant symptoms, non worsening  Goal status: INITIAL   5. Pt will report at least 50% decrease in overall pain levels in past week in order to facilitate improved tolerance to basic ADLs/mobility.   Baseline: 0-8/10 in past week  Goal status: INITIAL    6. 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   Baseline: up to 8/10 at worst, pain increases with 1 hr sitting   Goal status: INITIAL   PLAN:  PT FREQUENCY: 2x/week   PT DURATION: 8 weeks  PLANNED INTERVENTIONS: Therapeutic exercises, Therapeutic activity, Neuromuscular re-education, Balance training, Gait training, Patient/Family education, Self Care, Joint mobilization, Joint manipulation, Stair training, Aquatic Therapy, Dry Needling, Electrical stimulation, Spinal manipulation, Spinal mobilization, Cryotherapy, Moist heat, Taping, Traction, Manual  therapy, and Re-evaluation.  PLAN FOR NEXT SESSION: review/update HEP PRN. Would likely benefit from trunk extensor endurance exercises, hip mobility and rotational strength. Manual PRN as indicate, pt interested in spinal manipulation, hip mobilization   Mauri Reading, PT, DPT  03/20/2023 1:03 PM    Check all possible CPT codes: 16109 - PT Re-evaluation, 97110- Therapeutic Exercise, [phone]- Neuro Re-education, [phone] - Gait Training, [phone] - Manual Therapy, [phone] - Therapeutic Activities, [phone] - Self Care, [phone] - Physical performance training, and [phone] - Aquatic therapy    Check all conditions that are expected to impact treatment: {Conditions expected to impact treatment:Musculoskeletal disorders   If treatment provided at initial evaluation, no treatment charged due to lack of authorization.

## 2023-03-25 ENCOUNTER — Ambulatory Visit: Payer: Medicaid Other | Attending: Neurosurgery

## 2023-03-25 DIAGNOSIS — M25652 Stiffness of left hip, not elsewhere classified: Secondary | ICD-10-CM | POA: Insufficient documentation

## 2023-03-25 DIAGNOSIS — M5459 Other low back pain: Secondary | ICD-10-CM | POA: Diagnosis present

## 2023-03-25 DIAGNOSIS — M25651 Stiffness of right hip, not elsewhere classified: Secondary | ICD-10-CM | POA: Diagnosis present

## 2023-03-25 NOTE — Therapy (Signed)
OUTPATIENT PHYSICAL THERAPY TREATMENT NOTE   Patient Name: Jay Huerta MRN: [id number] DOB:Nov 01, 1985, 37 y.o., male Today's Date: 03/25/2023  END OF SESSION:  PT End of Session - 03/25/23 1216     Visit Number 4    Number of Visits 17    Date for PT Re-Evaluation 04/18/23    Authorization Type [employer]    Authorization Time Period 5 visits approved 03/10/23-05/08/23    PT Start Time 1215    PT Stop Time 1255    PT Time Calculation (min) 40 min               History reviewed. No pertinent past medical history. Past Surgical History:  Procedure Laterality Date   INGUINAL HERNIA REPAIR     There are no problems to display for this patient.   PCP: no PCP in chart  REFERRING PROVIDER: Bedelia Person, MD  REFERRING DIAG: M54.9 (ICD-10-CM) - Dorsalgia, unspecified  Rationale for Evaluation and Treatment: Rehabilitation  THERAPY DIAG:  Stiffness of right hip, not elsewhere classified  Stiffness of left hip, not elsewhere classified  Other low back pain  ONSET DATE: since college  SUBJECTIVE:                                                                                                                                                                                           SUBJECTIVE STATEMENT: Patient reports that he had a stretching session this morning and feeling a bit better in his lower back.    PERTINENT HISTORY:  No PMH in chart   PAIN:  Are you having pain: none Location/description: throbbing in tailbone and belt line Pain: 0/10  - aggravating factors: heavier activity, basketball, prolonged sitting >1hr  - Easing factors: massage, heat, stretching  PRECAUTIONS: None  WEIGHT BEARING RESTRICTIONS: No  FALLS:  Has patient fallen in last 6 months? 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   PLOF: Independent - active, enjoys  sports  PATIENT GOALS: be able to manage pain better, get back and hips loosened up  NEXT MD VISIT: July   OBJECTIVE:   DIAGNOSTIC FINDINGS:  Per review of paper referral (see media tab) no acute findings on lumbar XR   PATIENT SURVEYS:  Modified Oswestry: 19/50 raw score, 38%  SCREENING FOR RED FLAGS: Red flag questioning/screening reassuring    COGNITION: Overall cognitive status: Within functional limits for tasks assessed     SENSATION/NEURO: Light touch intact all extremities   No clonus either LE   POSTURE: significantly rounded shoulders,  hyperlordotic lumbar spine in sitting, tendency for increased kyphosis sitting  PALPATION: Tenderness to palpation BIL thoracolumbar paraspinals more tender approaching SIJ, most tender at distal QL attachments BIL and BIL glute/piriformis  LUMBAR ROM:   AROM eval  Flexion Mid shin *  Extension 75% *   Right lateral flexion Knee joint *  Left lateral flexion Knee joint *   Right rotation 100% *  Left rotation 100% *    (Blank rows = not tested) (Key: WFL = within functional limits not formally assessed, * = concordant pain, s = stiffness/stretching sensation, NT = not tested)   LOWER EXTREMITY ROM:     Passive  Right eval Left eval  Hip flexion    Hip extension    Hip internal rotation    Hip external rotation    Knee extension    Knee flexion    (Blank rows = not tested) (Key: WFL = within functional limits not formally assessed, * = concordant pain, s = stiffness/stretching sensation, NT = not tested)  Comments: see hip testing below  LOWER EXTREMITY MMT:    MMT Right eval Left eval  Hip flexion 4+ groin pain 4+  Hip abduction (modified sitting) 5 5  Hip internal rotation 4+ mild groin pain and lateral hip pain 5  Hip external rotation 4+ 5  Knee flexion 5 5  Knee extension 5 5  Ankle dorsiflexion 5 5   (Blank rows = not tested) (Key: WFL = within functional limits not formally assessed, * = concordant  pain, s = stiffness/stretching sensation, NT = not tested)  Comments:    LUMBAR TESTS: Prone trunk extension repeated: elicits concordant symptoms non worsening over 10 repetitions   HIP TESTS: FABER + BIL FADDIR + R negative L Significant relief with hip distraction BIL Initial discomfort with PA SIJ mobilization but also describes as relieving   GAIT: Distance walked: within clinic Assistive device utilized: None Level of assistance: Complete Independence Comments: mechanics grossly WNL  TODAY'S TREATMENT:     [facility] Adult PT Treatment:                                                DATE: 03/25/23 Therapeutic Exercise: 90/90 heel taps with 5#, 2 x 10 Glute bridges x 10, with blue TB for hip abduction 2 x 10 SLR with contralateral pilates ring push for TA activation 2x10 BIL Sidelying hip abduction 5#, 2 x 15  Seated pilates ring squeeze, 5 sec x 15  Standing Chops on foam with cc #17 x 15 each side  Standing Reverse Chops on foam with cc #10 x 15 each side Pallof press with cable machine, #20 x 2x10 each side  Seated pball roll outs fwd x10   [facility] Adult PT Treatment:                                                DATE: 03/20/2023  Therapeutic Exercise: 90/90 heel taps with 5#, 2 x 10 Side planks, 2 x 15 Glute bridges x 10, with blue TB for hip abduction 2 x 10 Sidelying hip abduction 5#, 2 x 15  Prone Superman, 3 x 5, 3 sec holds 1st set, 5 sec holds 2nd set Seated  ball squeeze, 5 sec x 15  Standing Chops on foam with cc #17 x 15 each side  Pallof press with cable machine, #20 x 15 each side    [facility] Adult PT Treatment:                                                DATE: 03/18/2023  Therapeutic Exercise: Seated 3-way flexion ball roll for lumbar warm up, x 5 each 90/90 heel taps with blue band, x10, x15 Side planks, 2 x 10  Glute bridges with resisted hip abduction, blue TB, 2 x 10  Sidelying hip adduction, 2 x 10, modified R hip adduction d/t groin pain and  performing  Prone Superman, 3 x 5, 3 sec holds 1st set, 5 sec holds 2nd set Standing Chops on foam with cc #13 x15 each side, #17 x 15 each side    PATIENT EDUCATION:  Education details: Pt education on PT impairments, prognosis, and POC. Informed consent. Rationale for interventions, safe/appropriate HEP performance Person educated: Patient Education method: Explanation, Demonstration, Tactile cues, Verbal cues, and Handouts Education comprehension: verbalized understanding, returned demonstration, verbal cues required, tactile cues required, and needs further education    HOME EXERCISE PROGRAM: Access Code: [email or web address] URL: [email or web address] Date: 02/21/2023 Prepared by: Fransisco Hertz  Exercises - Prone Press Up On Elbows  - 1 x daily - 7 x weekly - 2-3 sets - 10 reps  ASSESSMENT:  CLINICAL IMPRESSION: Patient presents to PT reporting no current back pain and that he went to the [employer] this morning which has helped his pain. Session today continued to progress core and proximal hip strengthening to good effect, no increase in pain throughout. Patient continues to benefit from skilled PT services and should be progressed as able to improve functional independence.    OBJECTIVE IMPAIRMENTS: decreased activity tolerance, decreased endurance, decreased mobility, decreased ROM, decreased strength, hypomobility, impaired perceived functional ability, postural dysfunction, and pain.   ACTIVITY LIMITATIONS: carrying, lifting, sitting, and standing  PARTICIPATION LIMITATIONS: driving and community activity  PERSONAL FACTORS: Time since onset of injury/illness/exacerbation are also affecting patient's functional outcome.   REHAB POTENTIAL: Good  CLINICAL DECISION MAKING: Stable/uncomplicated  EVALUATION COMPLEXITY: Low   GOALS: Goals reviewed with patient? No given time constraints  SHORT TERM GOALS: Target date: 03/21/2023 Pt will demonstrate appropriate  understanding and performance of initially prescribed HEP in order to facilitate improved independence with management of symptoms.  Baseline: HEP provided on eval Goal status: INITIAL   2. Pt will score less than or equal to 27% on Modified Oswestry in order to demonstrate improved perception of function due to symptoms.  Baseline: 38%  Goal status: INITIAL    LONG TERM GOALS: Target date: 04/18/2023 Pt will score  less than or equal to 19% on Modified Oswestry in order to demonstrate improved perception of functional status due to symptoms.  Baseline: 38% Goal status: INITIAL  2.  Pt will demonstrate symmetrical and painless lumbar rotation AROM in order to demonstrate improved tolerance to functional movement patterns.  Baseline: see ROM chart above Goal status: INITIAL  3.  Pt will demonstrate hip ER/IR MMT of 5/5 BIL without pain in order to demonstrate improved strength for functional movements.  Baseline: see MMT chart above Goal status: INITIAL  4. Pt will perform 20 repetitions of repeated trunk extensions in prone without reproduction  of pain in order to facilitate improved postural endurance for sport activities.  Baseline: 10 repetitions with consistent concordant symptoms, non worsening  Goal status: INITIAL   5. Pt will report at least 50% decrease in overall pain levels in past week in order to facilitate improved tolerance to basic ADLs/mobility.   Baseline: 0-8/10 in past week  Goal status: INITIAL    6. 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   Baseline: up to 8/10 at worst, pain increases with 1 hr sitting   Goal status: INITIAL   PLAN:  PT FREQUENCY: 2x/week   PT DURATION: 8 weeks  PLANNED INTERVENTIONS: Therapeutic exercises, Therapeutic activity, Neuromuscular re-education, Balance training, Gait training,  Patient/Family education, Self Care, Joint mobilization, Joint manipulation, Stair training, Aquatic Therapy, Dry Needling, Electrical stimulation, Spinal manipulation, Spinal mobilization, Cryotherapy, Moist heat, Taping, Traction, Manual therapy, and Re-evaluation.  PLAN FOR NEXT SESSION: review/update HEP PRN. Would likely benefit from trunk extensor endurance exercises, hip mobility and rotational strength. Manual PRN as indicate, pt interested in spinal manipulation, hip mobilization   Berta Minor PTA 03/25/2023 12:54 PM    Check all possible CPT codes: 09811 - PT Re-evaluation, 97110- Therapeutic Exercise, [phone]- Neuro Re-education, [phone] - Gait Training, [phone] - Manual Therapy, [phone] - Therapeutic Activities, [phone] - Self Care, [phone] - Physical performance training, and [phone] - Aquatic therapy    Check all conditions that are expected to impact treatment: {Conditions expected to impact treatment:Musculoskeletal disorders   If treatment provided at initial evaluation, no treatment charged due to lack of authorization.

## 2023-03-26 NOTE — Therapy (Signed)
OUTPATIENT PHYSICAL THERAPY TREATMENT NOTE   Patient Name: Jay Huerta MRN: [id number] DOB:1985/12/08, 37 y.o., male Today's Date: 03/27/2023  END OF SESSION:  PT End of Session - 03/27/23 1223     Visit Number 5    Number of Visits 17    Date for PT Re-Evaluation 04/18/23    Authorization Type [employer] blue    Authorization Time Period 5 visits approved 03/10/23-05/08/23    PT Start Time 1218    PT Stop Time 1248    PT Time Calculation (min) 30 min    Activity Tolerance Patient tolerated treatment well;No increased pain    Behavior During Therapy [facility] for tasks assessed/performed                No past medical history on file. Past Surgical History:  Procedure Laterality Date   INGUINAL HERNIA REPAIR     There are no problems to display for this patient.   PCP: no PCP in chart  REFERRING PROVIDER: Bedelia Person, MD  REFERRING DIAG: M54.9 (ICD-10-CM) - Dorsalgia, unspecified  Rationale for Evaluation and Treatment: Rehabilitation  THERAPY DIAG:  Stiffness of right hip, not elsewhere classified  Stiffness of left hip, not elsewhere classified  Other low back pain  ONSET DATE: since college  SUBJECTIVE:                                                                                                                                                                                           SUBJECTIVE STATEMENT: Patient reports that he returned to playing basketball on Tuesday and did okay "with an anti-inflammatory." He is trying to switch his [facility] appt to after his PT sessions starting today.    PERTINENT HISTORY:  No PMH in chart   PAIN:  Are you having pain: none Location/description: throbbing in tailbone and belt line Pain: 0/10  - aggravating factors: heavier activity, basketball, prolonged sitting >1hr  - Easing factors: massage, heat, stretching  PRECAUTIONS: None  WEIGHT BEARING RESTRICTIONS: No  FALLS:  Has patient fallen  in last 6 months? 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   PLOF: Independent - active, enjoys sports  PATIENT GOALS: be able to manage pain better, get back and hips loosened up  NEXT MD VISIT: July   OBJECTIVE:   DIAGNOSTIC FINDINGS:  Per review of paper referral (see media tab) no acute findings on lumbar XR   PATIENT SURVEYS:  Modified Oswestry: 19/50 raw score, 38%  SCREENING FOR RED FLAGS: Red flag questioning/screening reassuring  COGNITION: Overall cognitive status: Within functional limits for tasks assessed     SENSATION/NEURO: Light touch intact all extremities   No clonus either LE   POSTURE: significantly rounded shoulders, hyperlordotic lumbar spine in sitting, tendency for increased kyphosis sitting  PALPATION: Tenderness to palpation BIL thoracolumbar paraspinals more tender approaching SIJ, most tender at distal QL attachments BIL and BIL glute/piriformis  LUMBAR ROM:   AROM eval  Flexion Mid shin *  Extension 75% *   Right lateral flexion Knee joint *  Left lateral flexion Knee joint *   Right rotation 100% *  Left rotation 100% *    (Blank rows = not tested) (Key: WFL = within functional limits not formally assessed, * = concordant pain, s = stiffness/stretching sensation, NT = not tested)   LOWER EXTREMITY ROM:     Passive  Right eval Left eval  Hip flexion    Hip extension    Hip internal rotation    Hip external rotation    Knee extension    Knee flexion    (Blank rows = not tested) (Key: WFL = within functional limits not formally assessed, * = concordant pain, s = stiffness/stretching sensation, NT = not tested)  Comments: see hip testing below  LOWER EXTREMITY MMT:    MMT Right eval Left eval  Hip flexion 4+ groin pain 4+  Hip abduction (modified sitting) 5 5  Hip internal rotation 4+ mild groin pain and lateral hip pain 5  Hip  external rotation 4+ 5  Knee flexion 5 5  Knee extension 5 5  Ankle dorsiflexion 5 5   (Blank rows = not tested) (Key: WFL = within functional limits not formally assessed, * = concordant pain, s = stiffness/stretching sensation, NT = not tested)  Comments:    LUMBAR TESTS: Prone trunk extension repeated: elicits concordant symptoms non worsening over 10 repetitions   HIP TESTS: FABER + BIL FADDIR + R negative L Significant relief with hip distraction BIL Initial discomfort with PA SIJ mobilization but also describes as relieving   GAIT: Distance walked: within clinic Assistive device utilized: None Level of assistance: Complete Independence Comments: mechanics grossly WNL  TODAY'S TREATMENT:      [facility] Adult PT Treatment:                                                DATE: 03/27/2023   Therapeutic Exercise: 90/90 heel taps with 5#, 2 x 10 SLR with contralateral pilates ring push for TA activation 3x10 BIL Sidelying hip abduction 5#, 2 x 15  R sidelying hip adduction with blue theraband, 2 x 10  Seated pilates ring squeeze, 5 sec, 2 x 10  Therapeutic Activity: Reviewed progress towards goals   [facility] Adult PT Treatment:                                                DATE: 03/25/23 Therapeutic Exercise: 90/90 heel taps with 5#, 2 x 10 Glute bridges x 10, with blue TB for hip abduction 2 x 10 SLR with contralateral pilates ring push for TA activation 2x10 BIL Sidelying hip abduction 5#, 2 x 15  Seated pilates ring squeeze, 5 sec x 15  Standing Chops on  foam with cc #17 x 15 each side  Standing Reverse Chops on foam with cc #10 x 15 each side Pallof press with cable machine, #20 x 2x10 each side  Seated pball roll outs fwd x10   [facility] Adult PT Treatment:                                                DATE: 03/20/2023  Therapeutic Exercise: 90/90 heel taps with 5#, 2 x 10 Side planks, 2 x 15 Glute bridges x 10, with blue TB for hip abduction 2 x 10 Sidelying hip  abduction 5#, 2 x 15  Prone Superman, 3 x 5, 3 sec holds 1st set, 5 sec holds 2nd set Seated ball squeeze, 5 sec x 15  Standing Chops on foam with cc #17 x 15 each side  Pallof press with cable machine, #20 x 15 each side     PATIENT EDUCATION:  Education details: Pt education on PT impairments, prognosis, and POC. Informed consent. Rationale for interventions, safe/appropriate HEP performance Person educated: Patient Education method: Explanation, Demonstration, Tactile cues, Verbal cues, and Handouts Education comprehension: verbalized understanding, returned demonstration, verbal cues required, tactile cues required, and needs further education    HOME EXERCISE PROGRAM: Access Code: [email or web address] URL: [email or web address] Date: 02/21/2023 Prepared by: Fransisco Hertz  Exercises - Prone Press Up On Elbows  - 1 x daily - 7 x weekly - 2-3 sets - 10 reps  ASSESSMENT:  CLINICAL IMPRESSION: Jay Huerta had abbreviated session today d/t appointment at [facility]. He was able to tolerate all exercises with some muscle fatigue. He reports feeling that his sidelying hip adduction exercise has been very helpful, and would like to continue with this one. We will continue with progression of core strength and endurance activities.     OBJECTIVE IMPAIRMENTS: decreased activity tolerance, decreased endurance, decreased mobility, decreased ROM, decreased strength, hypomobility, impaired perceived functional ability, postural dysfunction, and pain.   ACTIVITY LIMITATIONS: carrying, lifting, sitting, and standing  PARTICIPATION LIMITATIONS: driving and community activity  PERSONAL FACTORS: Time since onset of injury/illness/exacerbation are also affecting patient's functional outcome.   REHAB POTENTIAL: Good  CLINICAL DECISION MAKING: Stable/uncomplicated  EVALUATION COMPLEXITY: Low   GOALS: Goals reviewed with patient? No given time constraints  SHORT TERM GOALS: Target date:  03/21/2023  Pt will demonstrate appropriate understanding and performance of initially prescribed HEP in order to facilitate improved independence with management of symptoms.  Baseline: HEP provided on eval Goal status: MET  2. Pt will score less than or equal to 27% on Modified Oswestry in order to demonstrate improved perception of function due to symptoms.  Baseline: 38%  03/27/23: 28%  Goal status: Partially Met  LONG TERM GOALS: Target date: 04/18/2023 Pt will score  less than or equal to 19% on Modified Oswestry in order to demonstrate improved perception of functional status due to symptoms.  Baseline: 38% 03/27/23: 28% Goal status: PROGRESSING  2.  Pt will demonstrate symmetrical and painless lumbar rotation AROM in order to demonstrate improved tolerance to functional movement patterns.  Baseline: see ROM chart above Goal status: INITIAL  3.  Pt will demonstrate hip ER/IR MMT of 5/5 BIL without pain in order to demonstrate improved strength for functional movements.  Baseline: see MMT chart above Goal status: INITIAL  4. Pt will perform 20 repetitions of repeated trunk extensions in prone  without reproduction of pain in order to facilitate improved postural endurance for sport activities.  Baseline: 10 repetitions with consistent concordant symptoms, non worsening  Goal status: INITIAL   5. Pt will report at least 50% decrease in overall pain levels in past week in order to facilitate improved tolerance to basic ADLs/mobility.   Baseline: 0-8/10 in past week  Goal status: INITIAL    6. 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   Baseline: up to 8/10 at worst, pain increases with 1 hr sitting   Goal status: INITIAL   PLAN:  PT FREQUENCY: 2x/week   PT DURATION: 8 weeks  PLANNED INTERVENTIONS: Therapeutic exercises, Therapeutic  activity, Neuromuscular re-education, Balance training, Gait training, Patient/Family education, Self Care, Joint mobilization, Joint manipulation, Stair training, Aquatic Therapy, Dry Needling, Electrical stimulation, Spinal manipulation, Spinal mobilization, Cryotherapy, Moist heat, Taping, Traction, Manual therapy, and Re-evaluation.  PLAN FOR NEXT SESSION: review/update HEP PRN. Would likely benefit from trunk extensor endurance exercises, hip mobility and rotational strength. Manual PRN as indicate, pt interested in spinal manipulation, hip mobilization   Mauri Reading PT, DPT 03/27/2023 1:10 PM

## 2023-03-27 ENCOUNTER — Ambulatory Visit: Payer: Medicaid Other

## 2023-03-27 DIAGNOSIS — M5459 Other low back pain: Secondary | ICD-10-CM

## 2023-03-27 DIAGNOSIS — M25651 Stiffness of right hip, not elsewhere classified: Secondary | ICD-10-CM

## 2023-03-27 DIAGNOSIS — M25652 Stiffness of left hip, not elsewhere classified: Secondary | ICD-10-CM

## 2023-04-01 ENCOUNTER — Ambulatory Visit: Payer: Medicaid Other

## 2023-04-01 DIAGNOSIS — M25651 Stiffness of right hip, not elsewhere classified: Secondary | ICD-10-CM | POA: Diagnosis not present

## 2023-04-01 DIAGNOSIS — M5459 Other low back pain: Secondary | ICD-10-CM

## 2023-04-01 DIAGNOSIS — M25652 Stiffness of left hip, not elsewhere classified: Secondary | ICD-10-CM

## 2023-04-01 NOTE — Therapy (Signed)
OUTPATIENT PHYSICAL THERAPY TREATMENT NOTE   Patient Name: Jay Huerta MRN: [id number] DOB:Oct 16, 1986, 37 y.o., male Today's Date: 04/01/2023  END OF SESSION:  PT End of Session - 04/01/23 1224     Visit Number 6    Number of Visits 17    Date for PT Re-Evaluation 04/18/23    Authorization Type [employer]    Authorization Time Period 5 visits approved 03/10/23-05/08/23    PT Start Time 1225    PT Stop Time 1300    PT Time Calculation (min) 35 min    Activity Tolerance Patient tolerated treatment well;No increased pain    Behavior During Therapy [facility] for tasks assessed/performed                 No past medical history on file. Past Surgical History:  Procedure Laterality Date   INGUINAL HERNIA REPAIR     There are no problems to display for this patient.   PCP: no PCP in chart  REFERRING PROVIDER: Bedelia Person, MD  REFERRING DIAG: M54.9 (ICD-10-CM) - Dorsalgia, unspecified  Rationale for Evaluation and Treatment: Rehabilitation  THERAPY DIAG:  Stiffness of right hip, not elsewhere classified  Stiffness of left hip, not elsewhere classified  Other low back pain  ONSET DATE: since college  SUBJECTIVE:                                                                                                                                                                                           SUBJECTIVE STATEMENT: Patient reports that doesn't normally feel limited with work activities by his back pain; he is experiencing groin pain however. He has been able to return to playing basketball twice a week, but reports that the next day his has tightness and aching pain for awhile. He continues to have appointments with [facility] which he feels is helpful to his recovery.    PERTINENT HISTORY:  No PMH in chart   PAIN:  Are you having pain: none Location/description: throbbing in tailbone and belt line Pain: 0/10  - aggravating factors: heavier  activity, basketball, prolonged sitting >1hr  - Easing factors: massage, heat, stretching  PRECAUTIONS: None  WEIGHT BEARING RESTRICTIONS: No  FALLS:  Has patient fallen in last 6 months? 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   PLOF: Independent - active, enjoys sports  PATIENT GOALS: be able to manage pain better, get back and hips loosened up  NEXT MD VISIT: July   OBJECTIVE:   DIAGNOSTIC FINDINGS:  Per review of paper referral (  see media tab) no acute findings on lumbar XR   PATIENT SURVEYS:  Modified Oswestry: 19/50 raw score, 38%  SCREENING FOR RED FLAGS: Red flag questioning/screening reassuring    COGNITION: Overall cognitive status: Within functional limits for tasks assessed     SENSATION/NEURO: Light touch intact all extremities   No clonus either LE   POSTURE: significantly rounded shoulders, hyperlordotic lumbar spine in sitting, tendency for increased kyphosis sitting  PALPATION: Tenderness to palpation BIL thoracolumbar paraspinals more tender approaching SIJ, most tender at distal QL attachments BIL and BIL glute/piriformis  LUMBAR ROM:   AROM eval 04/01/23  Flexion Mid shin * Toes   Extension 75% *  90%  Right lateral flexion Knee joint * Distal to knee joint  Left lateral flexion Knee joint *  Distal to knee joint  Right rotation 100% *   Left rotation 100% *     (Blank rows = not tested) (Key: WFL = within functional limits not formally assessed, * = concordant pain, s = stiffness/stretching sensation, NT = not tested)   LOWER EXTREMITY ROM:     Passive  Right eval Left eval  Hip flexion    Hip extension    Hip internal rotation    Hip external rotation    Knee extension    Knee flexion    (Blank rows = not tested) (Key: WFL = within functional limits not formally assessed, * = concordant pain, s = stiffness/stretching sensation, NT = not  tested)  Comments: see hip testing below  LOWER EXTREMITY MMT:    MMT Right eval Left eval Right 04/01/23  Hip flexion 4+ groin pain 4+ 5  Hip abduction (modified sitting) 5 5   Hip internal rotation 4+ mild groin pain and lateral hip pain 5 4+, groin pain  Hip external rotation 4+ 5 5/5  Knee flexion 5 5   Knee extension 5 5   Ankle dorsiflexion 5 5    (Blank rows = not tested) (Key: WFL = within functional limits not formally assessed, * = concordant pain, s = stiffness/stretching sensation, NT = not tested)  Comments:    LUMBAR TESTS: Prone trunk extension repeated: elicits concordant symptoms non worsening over 10 repetitions   HIP TESTS: FABER + BIL FADDIR + R negative L Significant relief with hip distraction BIL Initial discomfort with PA SIJ mobilization but also describes as relieving   GAIT: Distance walked: within clinic Assistive device utilized: None Level of assistance: Complete Independence Comments: mechanics grossly WNL  TODAY'S TREATMENT:      [facility] Adult PT Treatment:                                                DATE: 04/01/2023  Therapeutic Exercise: 90/90 heel taps with 5#, 2 x 15 SLR with contralateral pilates ring push for TA activation 2x15 BIL Sidelying hip abduction 5#, 2 x 15  Seated pilates ring squeeze, 5 sec x 30 Anti-rotation walk outs, 17# cable column x 10 each side  Therapeutic Activity:  Reassessment of objective measures and subjective progress towards established goals Patient education regarding progress, goals for remaining POC, importance of incorporating more frequent strength and flexibility into independent home program.    [facility] Adult PT Treatment:  DATE: 03/27/2023   Therapeutic Exercise: 90/90 heel taps with 5#, 2 x 10 SLR with contralateral pilates ring push for TA activation 3x10 BIL Sidelying hip abduction 5#, 2 x 15  R sidelying hip adduction with blue theraband, 2 x  10  Seated pilates ring squeeze, 5 sec, 2 x 10  Therapeutic Activity: Reviewed progress towards goals   [facility] Adult PT Treatment:                                                DATE: 03/25/23 Therapeutic Exercise: 90/90 heel taps with 5#, 2 x 10 Glute bridges x 10, with blue TB for hip abduction 2 x 10 SLR with contralateral pilates ring push for TA activation 2x10 BIL Sidelying hip abduction 5#, 2 x 15  Seated pilates ring squeeze, 5 sec x 15  Standing Chops on foam with cc #17 x 15 each side  Standing Reverse Chops on foam with cc #10 x 15 each side Pallof press with cable machine, #20 x 2x10 each side  Seated pball roll outs fwd x10   [facility] Adult PT Treatment:                                                DATE: 03/20/2023  Therapeutic Exercise: 90/90 heel taps with 5#, 2 x 10 Side planks, 2 x 15 Glute bridges x 10, with blue TB for hip abduction 2 x 10 Sidelying hip abduction 5#, 2 x 15  Prone Superman, 3 x 5, 3 sec holds 1st set, 5 sec holds 2nd set Seated ball squeeze, 5 sec x 15  Standing Chops on foam with cc #17 x 15 each side  Pallof press with cable machine, #20 x 15 each side     PATIENT EDUCATION:  Education details: Pt education on PT impairments, prognosis, and POC. Informed consent. Rationale for interventions, safe/appropriate HEP performance Person educated: Patient Education method: Explanation, Demonstration, Tactile cues, Verbal cues, and Handouts Education comprehension: verbalized understanding, returned demonstration, verbal cues required, tactile cues required, and needs further education    HOME EXERCISE PROGRAM: Access Code: [email or web address] URL: [email or web address] Date: 02/21/2023 Prepared by: Fransisco Hertz  Exercises - Prone Press Up On Elbows  - 1 x daily - 7 x weekly - 2-3 sets - 10 reps  ASSESSMENT:  CLINICAL IMPRESSION: Jay Huerta is demonstrating steady progress towards established goals, including improved lumbar AROM and LE  strength. He is limited by R groin pain with LE strengthening and core strengthening exercises. He would benefit from ongoing skilled PT services to address ongoing deficits, including dynamic core strengthening and activity tolerance for safe return to PLOF.    OBJECTIVE IMPAIRMENTS: decreased activity tolerance, decreased endurance, decreased mobility, decreased ROM, decreased strength, hypomobility, impaired perceived functional ability, postural dysfunction, and pain.   ACTIVITY LIMITATIONS: carrying, lifting, sitting, and standing  PARTICIPATION LIMITATIONS: driving and community activity  PERSONAL FACTORS: Time since onset of injury/illness/exacerbation are also affecting patient's functional outcome.   REHAB POTENTIAL: Good  CLINICAL DECISION MAKING: Stable/uncomplicated  EVALUATION COMPLEXITY: Low   GOALS: Goals reviewed with patient? Yes  SHORT TERM GOALS: Target date: 03/21/2023  Pt will demonstrate appropriate understanding and performance of initially prescribed HEP in order to facilitate improved independence  with management of symptoms.  Baseline: HEP provided on eval Goal status: MET  2. Pt will score less than or equal to 27% on Modified Oswestry in order to demonstrate improved perception of function due to symptoms.  Baseline: 38%  03/27/23: 28%  Goal status: Partially Met  LONG TERM GOALS: Target date: 04/26/2023, updated as of 04/01/23 Pt will score  less than or equal to 19% on Modified Oswestry in order to demonstrate improved perception of functional status due to symptoms.  Baseline: 38% 03/27/23: 28% Goal status: PROGRESSING  2.  Pt will demonstrate symmetrical and painless lumbar rotation AROM in order to demonstrate improved tolerance to functional movement patterns.  Baseline: see ROM chart above Goal status: MET  3.  Pt will demonstrate hip ER/IR MMT of 5/5 BIL without pain in order to demonstrate improved strength for functional movements.  Baseline:  see MMT chart above Goal status: MET  4. Pt will perform 20 repetitions of repeated trunk extensions in prone without reproduction of pain in order to facilitate improved postural endurance for sport activities.  Baseline: 10 repetitions with consistent concordant symptoms, non worsening  Goal status: ONGOING  5. Pt will report at least 50% decrease in overall pain levels in past week in order to facilitate improved tolerance to basic ADLs/mobility.   Baseline: 0-8/10 in past week  Goal status: ONGOING  6. 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   Baseline: up to 8/10 at worst, pain increases with 1 hr sitting   Goal status: ONGOING  PLAN:  PT FREQUENCY: 2x/week   PT DURATION: 8 weeks  PLANNED INTERVENTIONS: Therapeutic exercises, Therapeutic activity, Neuromuscular re-education, Balance training, Gait training, Patient/Family education, Self Care, Joint mobilization, Joint manipulation, Stair training, Aquatic Therapy, Dry Needling, Electrical stimulation, Spinal manipulation, Spinal mobilization, Cryotherapy, Moist heat, Taping, Traction, Manual therapy, and Re-evaluation.  PLAN FOR NEXT SESSION: review/update HEP PRN. Would likely benefit from trunk extensor endurance exercises, hip mobility and rotational strength. Manual PRN as indicate, pt interested in spinal manipulation, hip mobilization   Mauri Reading PT, DPT 04/01/2023 2:52 PM

## 2023-04-03 ENCOUNTER — Ambulatory Visit: Payer: Medicaid Other | Admitting: Physical Therapy

## 2023-04-08 ENCOUNTER — Ambulatory Visit: Payer: Medicaid Other

## 2023-04-08 DIAGNOSIS — M5459 Other low back pain: Secondary | ICD-10-CM

## 2023-04-08 DIAGNOSIS — M25651 Stiffness of right hip, not elsewhere classified: Secondary | ICD-10-CM

## 2023-04-08 DIAGNOSIS — M25652 Stiffness of left hip, not elsewhere classified: Secondary | ICD-10-CM

## 2023-04-08 NOTE — Therapy (Signed)
OUTPATIENT PHYSICAL THERAPY TREATMENT NOTE   Patient Name: Jay Huerta MRN: [id number] DOB:01-02-1986, 37 y.o., male Today's Date: 04/08/2023  END OF SESSION:  PT End of Session - 04/08/23 1221     Visit Number 7    Number of Visits 17    Date for PT Re-Evaluation 04/26/23    Authorization Type [employer] blue    Authorization Time Period 5 visits approved 03/10/23-05/08/23    PT Start Time 1221    PT Stop Time 1300    PT Time Calculation (min) 39 min    Activity Tolerance Patient tolerated treatment well;No increased pain    Behavior During Therapy [facility] for tasks assessed/performed                  No past medical history on file. Past Surgical History:  Procedure Laterality Date   INGUINAL HERNIA REPAIR     There are no problems to display for this patient.   PCP: no PCP in chart  REFERRING PROVIDER: Bedelia Person, MD  REFERRING DIAG: M54.9 (ICD-10-CM) - Dorsalgia, unspecified  Rationale for Evaluation and Treatment: Rehabilitation  THERAPY DIAG:  Stiffness of right hip, not elsewhere classified  Stiffness of left hip, not elsewhere classified  Other low back pain  ONSET DATE: since college  SUBJECTIVE:                                                                                                                                                                                           SUBJECTIVE STATEMENT: Patient states that he took a break from his exercises over the weekend.   PERTINENT HISTORY:  No PMH in chart   PAIN:  Are you having pain: none Location/description: throbbing in tailbone and belt line Pain: 0/10  - aggravating factors: heavier activity, basketball, prolonged sitting >1hr  - Easing factors: massage, heat, stretching  PRECAUTIONS: None  WEIGHT BEARING RESTRICTIONS: No  FALLS:  Has patient fallen in last 6 months? 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   PLOF: Independent - active, enjoys sports  PATIENT GOALS: be able to manage pain better, get back and hips loosened up  NEXT MD VISIT: July   OBJECTIVE:   DIAGNOSTIC FINDINGS:  Per review of paper referral (see media tab) no acute findings on lumbar XR   PATIENT SURVEYS:  Modified Oswestry: 19/50 raw score, 38%  SCREENING FOR RED FLAGS: Red flag questioning/screening reassuring    COGNITION: Overall cognitive status: Within functional limits for tasks assessed     SENSATION/NEURO: Light  touch intact all extremities   No clonus either LE   POSTURE: significantly rounded shoulders, hyperlordotic lumbar spine in sitting, tendency for increased kyphosis sitting  PALPATION: Tenderness to palpation BIL thoracolumbar paraspinals more tender approaching SIJ, most tender at distal QL attachments BIL and BIL glute/piriformis  LUMBAR ROM:   AROM eval 04/01/23  Flexion Mid shin * Toes   Extension 75% *  90%  Right lateral flexion Knee joint * Distal to knee joint  Left lateral flexion Knee joint *  Distal to knee joint  Right rotation 100% *   Left rotation 100% *     (Blank rows = not tested) (Key: WFL = within functional limits not formally assessed, * = concordant pain, s = stiffness/stretching sensation, NT = not tested)   LOWER EXTREMITY ROM:     Passive  Right eval Left eval  Hip flexion    Hip extension    Hip internal rotation    Hip external rotation    Knee extension    Knee flexion    (Blank rows = not tested) (Key: WFL = within functional limits not formally assessed, * = concordant pain, s = stiffness/stretching sensation, NT = not tested)  Comments: see hip testing below  LOWER EXTREMITY MMT:    MMT Right eval Left eval Right 04/01/23  Hip flexion 4+ groin pain 4+ 5  Hip abduction (modified sitting) 5 5   Hip internal rotation 4+ mild groin pain and lateral hip pain 5 4+, groin pain  Hip external  rotation 4+ 5 5/5  Knee flexion 5 5   Knee extension 5 5   Ankle dorsiflexion 5 5    (Blank rows = not tested) (Key: WFL = within functional limits not formally assessed, * = concordant pain, s = stiffness/stretching sensation, NT = not tested)  Comments:    LUMBAR TESTS: Prone trunk extension repeated: elicits concordant symptoms non worsening over 10 repetitions   HIP TESTS: FABER + BIL FADDIR + R negative L Significant relief with hip distraction BIL Initial discomfort with PA SIJ mobilization but also describes as relieving   GAIT: Distance walked: within clinic Assistive device utilized: None Level of assistance: Complete Independence Comments: mechanics grossly WNL  TODAY'S TREATMENT:      [facility] Adult PT Treatment:                                                DATE: 04/08/2023  Therapeutic Exercise: 90/90 heel taps with 5#, 3 x 10 Glute bridges x 10, with blue TB for hip abduction 2 x 15 SLR with contralateral pilates ring push for TA activation 2x10 BIL, with 5# ankle weight Sidelying hip abduction 5#, 3 x 10  Seated pilates ring squeeze, 5 sec x 15  Standing Chops on foam with cc #17, 2 x 15 each side  Anti-rotation walk outs, 17# cable column, 2 x 10 each side   [facility] Adult PT Treatment:                                                DATE: 04/01/2023  Therapeutic Exercise: 90/90 heel taps with 5#, 2 x 15 SLR with contralateral pilates ring push for TA activation 2x15 BIL Sidelying  hip abduction 5#, 2 x 15  Seated pilates ring squeeze, 5 sec x 30 Anti-rotation walk outs, 17# cable column x 10 each side  Therapeutic Activity:  Reassessment of objective measures and subjective progress towards established goals Patient education regarding progress, goals for remaining POC, importance of incorporating more frequent strength and flexibility into independent home program.    [facility] Adult PT Treatment:                                                DATE: 03/27/2023    Therapeutic Exercise: 90/90 heel taps with 5#, 2 x 10 SLR with contralateral pilates ring push for TA activation 3x10 BIL Sidelying hip abduction 5#, 2 x 15  R sidelying hip adduction with blue theraband, 2 x 10  Seated pilates ring squeeze, 5 sec, 2 x 10  Therapeutic Activity: Reviewed progress towards goals   [facility] Adult PT Treatment:                                                DATE: 03/25/23 Therapeutic Exercise: 90/90 heel taps with 5#, 2 x 10 Glute bridges x 10, with blue TB for hip abduction 2 x 10 SLR with contralateral pilates ring push for TA activation 2x10 BIL Sidelying hip abduction 5#, 2 x 15  Seated pilates ring squeeze, 5 sec x 15  Standing Chops on foam with cc #17 x 15 each side  Standing Reverse Chops on foam with cc #10 x 15 each side Pallof press with cable machine, #20 x 2x10 each side  Seated pball roll outs fwd x10    PATIENT EDUCATION:  Education details: Pt education on PT impairments, prognosis, and POC. Informed consent. Rationale for interventions, safe/appropriate HEP performance Person educated: Patient Education method: Explanation, Demonstration, Tactile cues, Verbal cues, and Handouts Education comprehension: verbalized understanding, returned demonstration, verbal cues required, tactile cues required, and needs further education    HOME EXERCISE PROGRAM: Access Code: [email or web address] URL: [email or web address] Date: 02/21/2023 Prepared by: Fransisco Hertz  Exercises - Prone Press Up On Elbows  - 1 x daily - 7 x weekly - 2-3 sets - 10 reps  ASSESSMENT:  CLINICAL IMPRESSION: Marcia continues to progress well through his exercise program. We will continue to address core strengthening and LE strengthening in order to progress towards established goals.     OBJECTIVE IMPAIRMENTS: decreased activity tolerance, decreased endurance, decreased mobility, decreased ROM, decreased strength, hypomobility, impaired perceived functional  ability, postural dysfunction, and pain.   ACTIVITY LIMITATIONS: carrying, lifting, sitting, and standing  PARTICIPATION LIMITATIONS: driving and community activity  PERSONAL FACTORS: Time since onset of injury/illness/exacerbation are also affecting patient's functional outcome.   REHAB POTENTIAL: Good  CLINICAL DECISION MAKING: Stable/uncomplicated  EVALUATION COMPLEXITY: Low   GOALS: Goals reviewed with patient? Yes  SHORT TERM GOALS: Target date: 03/21/2023  Pt will demonstrate appropriate understanding and performance of initially prescribed HEP in order to facilitate improved independence with management of symptoms.  Baseline: HEP provided on eval Goal status: MET  2. Pt will score less than or equal to 27% on Modified Oswestry in order to demonstrate improved perception of function due to symptoms.  Baseline: 38%  03/27/23: 28%  Goal status: Partially Met  LONG  TERM GOALS: Target date: 04/26/2023, updated as of 04/01/23 Pt will score  less than or equal to 19% on Modified Oswestry in order to demonstrate improved perception of functional status due to symptoms.  Baseline: 38% 03/27/23: 28% Goal status: PROGRESSING  2.  Pt will demonstrate symmetrical and painless lumbar rotation AROM in order to demonstrate improved tolerance to functional movement patterns.  Baseline: see ROM chart above Goal status: MET  3.  Pt will demonstrate hip ER/IR MMT of 5/5 BIL without pain in order to demonstrate improved strength for functional movements.  Baseline: see MMT chart above Goal status: MET  4. Pt will perform 20 repetitions of repeated trunk extensions in prone without reproduction of pain in order to facilitate improved postural endurance for sport activities.  Baseline: 10 repetitions with consistent concordant symptoms, non worsening  Goal status: ONGOING  5. Pt will report at least 50% decrease in overall pain levels in past week in order to facilitate improved tolerance to  basic ADLs/mobility.   Baseline: 0-8/10 in past week  Goal status: ONGOING  6. 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   Baseline: up to 8/10 at worst, pain increases with 1 hr sitting   Goal status: ONGOING  PLAN:  PT FREQUENCY: 2x/week   PT DURATION: 8 weeks  PLANNED INTERVENTIONS: Therapeutic exercises, Therapeutic activity, Neuromuscular re-education, Balance training, Gait training, Patient/Family education, Self Care, Joint mobilization, Joint manipulation, Stair training, Aquatic Therapy, Dry Needling, Electrical stimulation, Spinal manipulation, Spinal mobilization, Cryotherapy, Moist heat, Taping, Traction, Manual therapy, and Re-evaluation.  PLAN FOR NEXT SESSION: review/update HEP PRN. Would likely benefit from trunk extensor endurance exercises, hip mobility and rotational strength. Manual PRN as indicate, pt interested in spinal manipulation, hip mobilization   Mauri Reading PT, DPT 04/08/2023 1:05 PM

## 2023-04-10 ENCOUNTER — Ambulatory Visit: Payer: Medicaid Other

## 2023-04-10 DIAGNOSIS — M25652 Stiffness of left hip, not elsewhere classified: Secondary | ICD-10-CM

## 2023-04-10 DIAGNOSIS — M5459 Other low back pain: Secondary | ICD-10-CM

## 2023-04-10 DIAGNOSIS — M25651 Stiffness of right hip, not elsewhere classified: Secondary | ICD-10-CM | POA: Diagnosis not present

## 2023-04-10 NOTE — Therapy (Signed)
OUTPATIENT PHYSICAL THERAPY TREATMENT NOTE   Patient Name: Jay Huerta MRN: [id number] DOB:01/09/1986, 37 y.o., male Today's Date: 04/10/2023  END OF SESSION:  PT End of Session - 04/10/23 1225     Visit Number 8    Number of Visits 17    Date for PT Re-Evaluation 04/26/23    Authorization Type [employer]    Authorization Time Period Approved 5 visits 04/02/23-05/31/23    Authorization - Visit Number 2    Authorization - Number of Visits 5    PT Start Time 1222    PT Stop Time 1300    PT Time Calculation (min) 38 min    Activity Tolerance Patient tolerated treatment well;No increased pain    Behavior During Therapy [facility] for tasks assessed/performed                  History reviewed. No pertinent past medical history. Past Surgical History:  Procedure Laterality Date   INGUINAL HERNIA REPAIR     There are no problems to display for this patient.   PCP: no PCP in chart  REFERRING PROVIDER: Bedelia Person, MD  REFERRING DIAG: M54.9 (ICD-10-CM) - Dorsalgia, unspecified  Rationale for Evaluation and Treatment: Rehabilitation  THERAPY DIAG:  Stiffness of right hip, not elsewhere classified  Stiffness of left hip, not elsewhere classified  Other low back pain  ONSET DATE: since college  SUBJECTIVE:                                                                                                                                                                                           SUBJECTIVE STATEMENT: Patient went to his stretching appt prior to today's session.   PERTINENT HISTORY:  No PMH in chart   PAIN:  Are you having pain: none Location/description: throbbing in tailbone and belt line Pain: 0/10  - aggravating factors: heavier activity, basketball, prolonged sitting >1hr  - Easing factors: massage, heat, stretching  PRECAUTIONS: None  WEIGHT BEARING RESTRICTIONS: No  FALLS:  Has patient fallen in last 6 months? 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   PLOF: Independent - active, enjoys sports  PATIENT GOALS: be able to manage pain better, get back and hips loosened up  NEXT MD VISIT: July   OBJECTIVE:   DIAGNOSTIC FINDINGS:  Per review of paper referral (see media tab) no acute findings on lumbar XR   PATIENT SURVEYS:  Modified Oswestry: 19/50 raw score, 38%  SCREENING FOR RED FLAGS: Red flag questioning/screening reassuring    COGNITION:  Overall cognitive status: Within functional limits for tasks assessed     SENSATION/NEURO: Light touch intact all extremities   No clonus either LE   POSTURE: significantly rounded shoulders, hyperlordotic lumbar spine in sitting, tendency for increased kyphosis sitting  PALPATION: Tenderness to palpation BIL thoracolumbar paraspinals more tender approaching SIJ, most tender at distal QL attachments BIL and BIL glute/piriformis  LUMBAR ROM:   AROM eval 04/01/23  Flexion Mid shin * Toes   Extension 75% *  90%  Right lateral flexion Knee joint * Distal to knee joint  Left lateral flexion Knee joint *  Distal to knee joint  Right rotation 100% *   Left rotation 100% *     (Blank rows = not tested) (Key: WFL = within functional limits not formally assessed, * = concordant pain, s = stiffness/stretching sensation, NT = not tested)   LOWER EXTREMITY ROM:     Passive  Right eval Left eval  Hip flexion    Hip extension    Hip internal rotation    Hip external rotation    Knee extension    Knee flexion    (Blank rows = not tested) (Key: WFL = within functional limits not formally assessed, * = concordant pain, s = stiffness/stretching sensation, NT = not tested)  Comments: see hip testing below  LOWER EXTREMITY MMT:    MMT Right eval Left eval Right 04/01/23  Hip flexion 4+ groin pain 4+ 5  Hip abduction (modified sitting) 5 5   Hip internal rotation  4+ mild groin pain and lateral hip pain 5 4+, groin pain  Hip external rotation 4+ 5 5/5  Knee flexion 5 5   Knee extension 5 5   Ankle dorsiflexion 5 5    (Blank rows = not tested) (Key: WFL = within functional limits not formally assessed, * = concordant pain, s = stiffness/stretching sensation, NT = not tested)  Comments:    LUMBAR TESTS: Prone trunk extension repeated: elicits concordant symptoms non worsening over 10 repetitions   HIP TESTS: FABER + BIL FADDIR + R negative L Significant relief with hip distraction BIL Initial discomfort with PA SIJ mobilization but also describes as relieving   GAIT: Distance walked: within clinic Assistive device utilized: None Level of assistance: Complete Independence Comments: mechanics grossly WNL  TODAY'S TREATMENT:      [facility] Adult PT Treatment:                                                DATE: 04/10/2023  Therapeutic Exercise:  90/90 heel taps with 6# ankle weight, 2 x 15 Glute bridges x 10, with black TB for hip abduction 2 x 15 SLR with contralateral pilates ring push for TA activation 2x10 BIL, with 6# ankle weight Sidelying hip abduction 6# ankle, 2 x 15 Hooklying pilates ring squeeze, 5 sec x 20 Prone hip extension, 2 x 10  Supine R hip adduction with black theraband, 2 x 10  Standing Chops on foam with cc #20, 2 x 15 each side  Anti-rotation walk outs, 20# cable column, x15 each side RDLs with 20# KB x 12 bilaterally   [facility] Adult PT Treatment:  DATE: 04/08/2023  Therapeutic Exercise: 90/90 heel taps with 5#, 3 x 10 Glute bridges x 10, with blue TB for hip abduction 2 x 15 SLR with contralateral pilates ring push for TA activation 2x10 BIL, with 5# ankle weight Sidelying hip abduction 5#, 3 x 10  Seated pilates ring squeeze, 5 sec x 15  Standing Chops on foam with cc #17, 2 x 15 each side  Anti-rotation walk outs, 17# cable column, 2 x 10 each side   [facility] Adult PT  Treatment:                                                DATE: 04/01/2023  Therapeutic Exercise: 90/90 heel taps with 5#, 2 x 15 SLR with contralateral pilates ring push for TA activation 2x15 BIL Sidelying hip abduction 5#, 2 x 15  Seated pilates ring squeeze, 5 sec x 30 Anti-rotation walk outs, 17# cable column x 10 each side  Therapeutic Activity:  Reassessment of objective measures and subjective progress towards established goals Patient education regarding progress, goals for remaining POC, importance of incorporating more frequent strength and flexibility into independent home program.    [facility] Adult PT Treatment:                                                DATE: 03/27/2023   Therapeutic Exercise: 90/90 heel taps with 5#, 2 x 10 SLR with contralateral pilates ring push for TA activation 3x10 BIL Sidelying hip abduction 5#, 2 x 15  R sidelying hip adduction with blue theraband, 2 x 10  Seated pilates ring squeeze, 5 sec, 2 x 10  Therapeutic Activity: Reviewed progress towards goals     PATIENT EDUCATION:  Education details: Pt education on PT impairments, prognosis, and POC. Informed consent. Rationale for interventions, safe/appropriate HEP performance Person educated: Patient Education method: Explanation, Demonstration, Tactile cues, Verbal cues, and Handouts Education comprehension: verbalized understanding, returned demonstration, verbal cues required, tactile cues required, and needs further education    HOME EXERCISE PROGRAM: Access Code: [email or web address] URL: [email or web address] Date: 02/21/2023 Prepared by: Fransisco Hertz  Exercises - Prone Press Up On Elbows  - 1 x daily - 7 x weekly - 2-3 sets - 10 reps  ASSESSMENT:  CLINICAL IMPRESSION: Dorean continues to tolerate progressions, including increased weight and repetitions without exacerbation of pain. He is demonstrated improved core and UE strength overall. We will continue to progress towards  established functional goals as able.     OBJECTIVE IMPAIRMENTS: decreased activity tolerance, decreased endurance, decreased mobility, decreased ROM, decreased strength, hypomobility, impaired perceived functional ability, postural dysfunction, and pain.   ACTIVITY LIMITATIONS: carrying, lifting, sitting, and standing  PARTICIPATION LIMITATIONS: driving and community activity  PERSONAL FACTORS: Time since onset of injury/illness/exacerbation are also affecting patient's functional outcome.   REHAB POTENTIAL: Good  CLINICAL DECISION MAKING: Stable/uncomplicated  EVALUATION COMPLEXITY: Low   GOALS: Goals reviewed with patient? Yes  SHORT TERM GOALS: Target date: 03/21/2023  Pt will demonstrate appropriate understanding and performance of initially prescribed HEP in order to facilitate improved independence with management of symptoms.  Baseline: HEP provided on eval Goal status: MET  2. Pt will score less than or equal to 27% on Modified Oswestry in order  to demonstrate improved perception of function due to symptoms.  Baseline: 38%  03/27/23: 28%  Goal status: Partially Met  LONG TERM GOALS: Target date: 04/26/2023, updated as of 04/01/23 Pt will score  less than or equal to 19% on Modified Oswestry in order to demonstrate improved perception of functional status due to symptoms.  Baseline: 38% 03/27/23: 28% Goal status: PROGRESSING  2.  Pt will demonstrate symmetrical and painless lumbar rotation AROM in order to demonstrate improved tolerance to functional movement patterns.  Baseline: see ROM chart above Goal status: MET  3.  Pt will demonstrate hip ER/IR MMT of 5/5 BIL without pain in order to demonstrate improved strength for functional movements.  Baseline: see MMT chart above Goal status: MET  4. Pt will perform 20 repetitions of repeated trunk extensions in prone without reproduction of pain in order to facilitate improved postural endurance for sport  activities.  Baseline: 10 repetitions with consistent concordant symptoms, non worsening  Goal status: ONGOING  5. Pt will report at least 50% decrease in overall pain levels in past week in order to facilitate improved tolerance to basic ADLs/mobility.   Baseline: 0-8/10 in past week  Goal status: ONGOING  6. 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   Baseline: up to 8/10 at worst, pain increases with 1 hr sitting   Goal status: ONGOING  PLAN:  PT FREQUENCY: 2x/week   PT DURATION: 8 weeks  PLANNED INTERVENTIONS: Therapeutic exercises, Therapeutic activity, Neuromuscular re-education, Balance training, Gait training, Patient/Family education, Self Care, Joint mobilization, Joint manipulation, Stair training, Aquatic Therapy, Dry Needling, Electrical stimulation, Spinal manipulation, Spinal mobilization, Cryotherapy, Moist heat, Taping, Traction, Manual therapy, and Re-evaluation.  PLAN FOR NEXT SESSION: review/update HEP PRN. Would likely benefit from trunk extensor endurance exercises, hip mobility and rotational strength. Manual PRN as indicate, pt interested in spinal manipulation, hip mobilization   Mauri Reading PT, DPT 04/10/2023 12:26 PM

## 2023-04-22 ENCOUNTER — Ambulatory Visit: Payer: Medicaid Other | Attending: Neurosurgery | Admitting: Physical Therapy

## 2023-04-22 ENCOUNTER — Ambulatory Visit: Payer: Medicaid Other

## 2023-04-22 ENCOUNTER — Encounter: Payer: Self-pay | Admitting: Physical Therapy

## 2023-04-22 DIAGNOSIS — M5459 Other low back pain: Secondary | ICD-10-CM | POA: Diagnosis present

## 2023-04-22 DIAGNOSIS — M25651 Stiffness of right hip, not elsewhere classified: Secondary | ICD-10-CM | POA: Diagnosis present

## 2023-04-22 DIAGNOSIS — M25652 Stiffness of left hip, not elsewhere classified: Secondary | ICD-10-CM | POA: Insufficient documentation

## 2023-04-22 NOTE — Therapy (Signed)
OUTPATIENT PHYSICAL THERAPY TREATMENT NOTE   Patient Name: Jay Huerta MRN: [id number] DOB:06-Mar-1986, 37 y.o., male Today's Date: 04/22/2023  END OF SESSION:  PT End of Session - 04/22/23 1402     Visit Number 9    Number of Visits 17    Date for PT Re-Evaluation 04/26/23    Authorization Type [employer]    Authorization Time Period Approved 5 visits 04/02/23-05/31/23    Authorization - Visit Number 3    Authorization - Number of Visits 5    PT Start Time 1402    PT Stop Time 1444    PT Time Calculation (min) 42 min    Activity Tolerance Patient tolerated treatment well;No increased pain    Behavior During Therapy [facility] for tasks assessed/performed                   History reviewed. No pertinent past medical history. Past Surgical History:  Procedure Laterality Date   INGUINAL HERNIA REPAIR     There are no problems to display for this patient.   PCP: no PCP in chart  REFERRING PROVIDER: Bedelia Person, MD  REFERRING DIAG: M54.9 (ICD-10-CM) - Dorsalgia, unspecified  Rationale for Evaluation and Treatment: Rehabilitation  THERAPY DIAG:  Stiffness of right hip, not elsewhere classified  Stiffness of left hip, not elsewhere classified  Other low back pain  ONSET DATE: since college  SUBJECTIVE:                                                                                                                                                                                           SUBJECTIVE STATEMENT: No pain at present, just left his stretching appointment. Still having some groin pain at times. Notes his tolerance to basketball seems to be improving, getting stretches same day.    PERTINENT HISTORY:  No PMH in chart   PAIN:  Are you having pain: none Location/description: throbbing in tailbone and belt line Pain: 0/10  - aggravating factors: heavier activity, basketball, prolonged sitting >1hr  - Easing factors: massage, heat,  stretching  PRECAUTIONS: None  WEIGHT BEARING RESTRICTIONS: No  FALLS:  Has patient fallen in last 6 months? 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   PLOF: Independent - active, enjoys sports  PATIENT GOALS: be able to manage pain better, get back and hips loosened up  NEXT MD VISIT: July   OBJECTIVE: (objective measures completed at initial evaluation unless otherwise dated)   DIAGNOSTIC FINDINGS:  Per review of paper referral (see media tab)  no acute findings on lumbar XR   PATIENT SURVEYS:  Modified Oswestry: 19/50 raw score, 38%  SCREENING FOR RED FLAGS: Red flag questioning/screening reassuring    COGNITION: Overall cognitive status: Within functional limits for tasks assessed     SENSATION/NEURO: Light touch intact all extremities   No clonus either LE   POSTURE: significantly rounded shoulders, hyperlordotic lumbar spine in sitting, tendency for increased kyphosis sitting  PALPATION: Tenderness to palpation BIL thoracolumbar paraspinals more tender approaching SIJ, most tender at distal QL attachments BIL and BIL glute/piriformis  LUMBAR ROM:   AROM eval 04/01/23  Flexion Mid shin * Toes   Extension 75% *  90%  Right lateral flexion Knee joint * Distal to knee joint  Left lateral flexion Knee joint *  Distal to knee joint  Right rotation 100% *   Left rotation 100% *     (Blank rows = not tested) (Key: WFL = within functional limits not formally assessed, * = concordant pain, s = stiffness/stretching sensation, NT = not tested)   LOWER EXTREMITY ROM:     Passive  Right eval Left eval  Hip flexion    Hip extension    Hip internal rotation    Hip external rotation    Knee extension    Knee flexion    (Blank rows = not tested) (Key: WFL = within functional limits not formally assessed, * = concordant pain, s = stiffness/stretching sensation, NT = not  tested)  Comments: see hip testing below  LOWER EXTREMITY MMT:    MMT Right eval Left eval Right 04/01/23  Hip flexion 4+ groin pain 4+ 5  Hip abduction (modified sitting) 5 5   Hip internal rotation 4+ mild groin pain and lateral hip pain 5 4+, groin pain  Hip external rotation 4+ 5 5/5  Knee flexion 5 5   Knee extension 5 5   Ankle dorsiflexion 5 5    (Blank rows = not tested) (Key: WFL = within functional limits not formally assessed, * = concordant pain, s = stiffness/stretching sensation, NT = not tested)  Comments:    LUMBAR TESTS: Prone trunk extension repeated: elicits concordant symptoms non worsening over 10 repetitions   HIP TESTS: FABER + BIL FADDIR + R negative L Significant relief with hip distraction BIL Initial discomfort with PA SIJ mobilization but also describes as relieving   GAIT: Distance walked: within clinic Assistive device utilized: None Level of assistance: Complete Independence Comments: mechanics grossly WNL  TODAY'S TREATMENT:     [facility] Adult PT Treatment:                                                DATE: 04/22/23 Therapeutic Exercise: Henreitta Leber + black band abduction 2x15 cues  SL bridge 2x8 BIL cues for pacing and form Cybex hip extension 30# x8 BIL, 42.5# x8 BIL, 50# x8 BIL cues for posture Hex bar RDL x12 (just bar), x8 with 25# each side cues for hip shift and posture, truncal mechanics SL RDL w/ contralat UE support 2x8 cues for form and mechanics CC paloff press 13# x8 BIL, cues for posture CC paloff press kickstand (IR bias) 7# BIL cues for posture and form HEP update + education  [facility] Adult PT Treatment:  DATE: 04/10/2023  Therapeutic Exercise:  90/90 heel taps with 6# ankle weight, 2 x 15 Glute bridges x 10, with black TB for hip abduction 2 x 15 SLR with contralateral pilates ring push for TA activation 2x10 BIL, with 6# ankle weight Sidelying hip abduction 6# ankle, 2 x  15 Hooklying pilates ring squeeze, 5 sec x 20 Prone hip extension, 2 x 10  Supine R hip adduction with black theraband, 2 x 10  Standing Chops on foam with cc #20, 2 x 15 each side  Anti-rotation walk outs, 20# cable column, x15 each side RDLs with 20# KB x 12 bilaterally   [facility] Adult PT Treatment:                                                DATE: 04/08/2023  Therapeutic Exercise: 90/90 heel taps with 5#, 3 x 10 Glute bridges x 10, with blue TB for hip abduction 2 x 15 SLR with contralateral pilates ring push for TA activation 2x10 BIL, with 5# ankle weight Sidelying hip abduction 5#, 3 x 10  Seated pilates ring squeeze, 5 sec x 15  Standing Chops on foam with cc #17, 2 x 15 each side  Anti-rotation walk outs, 17# cable column, 2 x 10 each side   [facility] Adult PT Treatment:                                                DATE: 04/01/2023  Therapeutic Exercise: 90/90 heel taps with 5#, 2 x 15 SLR with contralateral pilates ring push for TA activation 2x15 BIL Sidelying hip abduction 5#, 2 x 15  Seated pilates ring squeeze, 5 sec x 30 Anti-rotation walk outs, 17# cable column x 10 each side  Therapeutic Activity:  Reassessment of objective measures and subjective progress towards established goals Patient education regarding progress, goals for remaining POC, importance of incorporating more frequent strength and flexibility into independent home program.    [facility] Adult PT Treatment:                                                DATE: 03/27/2023   Therapeutic Exercise: 90/90 heel taps with 5#, 2 x 10 SLR with contralateral pilates ring push for TA activation 3x10 BIL Sidelying hip abduction 5#, 2 x 15  R sidelying hip adduction with blue theraband, 2 x 10  Seated pilates ring squeeze, 5 sec, 2 x 10  Therapeutic Activity: Reviewed progress towards goals     PATIENT EDUCATION:  Education details: rationale for interventions, HEP update, relevant anatomy/physiology  Person  educated: Patient Education method: Explanation, Demonstration, Tactile cues, Verbal cues, and Handouts Education comprehension: verbalized understanding, returned demonstration, verbal cues required, tactile cues required, and needs further education    HOME EXERCISE PROGRAM: Access Code: [email or web address] URL: [email or web address] Date: 04/22/2023 Prepared by: Fransisco Hertz  Exercises - Prone Press Up On Elbows  - 1 x daily - 7 x weekly - 2-3 sets - 10 reps - Supine 90/90 Alternating Toe Touch  - 1 x daily - 7 x weekly - 2 sets -  10 reps - Standing Diagonal Chop  - 1 x daily - 7 x weekly - 2 sets - 10 reps - Single Leg Bridge  - 1 x daily - 7 x weekly - 2 sets - 8 reps - Forward T with Counter Support  - 1 x daily - 7 x weekly - 2 sets - 8 reps  ASSESSMENT:  CLINICAL IMPRESSION: Pt arrives w/o pain, notes some improvement in tolerance to heavier activities since start of care but also mentions he has been scheduling stretching appointments on his game days. Continuing to progress for functional strengthening and increased demand on hip rotators/core musculature. Pt tolerates well and denies any pain or significant fatigue, no adverse events. HEP update as above. Recommend looking at goals next session. Pt departs today's session in no acute distress, all voiced questions/concerns addressed appropriately from PT perspective.       OBJECTIVE IMPAIRMENTS: decreased activity tolerance, decreased endurance, decreased mobility, decreased ROM, decreased strength, hypomobility, impaired perceived functional ability, postural dysfunction, and pain.   ACTIVITY LIMITATIONS: carrying, lifting, sitting, and standing  PARTICIPATION LIMITATIONS: driving and community activity  PERSONAL FACTORS: Time since onset of injury/illness/exacerbation are also affecting patient's functional outcome.   REHAB POTENTIAL: Good  CLINICAL DECISION MAKING: Stable/uncomplicated  EVALUATION COMPLEXITY:  Low   GOALS: Goals reviewed with patient? Yes  SHORT TERM GOALS: Target date: 03/21/2023  Pt will demonstrate appropriate understanding and performance of initially prescribed HEP in order to facilitate improved independence with management of symptoms.  Baseline: HEP provided on eval Goal status: MET  2. Pt will score less than or equal to 27% on Modified Oswestry in order to demonstrate improved perception of function due to symptoms.  Baseline: 38%  03/27/23: 28%  Goal status: Partially Met  LONG TERM GOALS: Target date: 04/26/2023, updated as of 04/01/23 Pt will score  less than or equal to 19% on Modified Oswestry in order to demonstrate improved perception of functional status due to symptoms.  Baseline: 38% 03/27/23: 28% Goal status: PROGRESSING  2.  Pt will demonstrate symmetrical and painless lumbar rotation AROM in order to demonstrate improved tolerance to functional movement patterns.  Baseline: see ROM chart above Goal status: MET  3.  Pt will demonstrate hip ER/IR MMT of 5/5 BIL without pain in order to demonstrate improved strength for functional movements.  Baseline: see MMT chart above Goal status: MET  4. Pt will perform 20 repetitions of repeated trunk extensions in prone without reproduction of pain in order to facilitate improved postural endurance for sport activities.  Baseline: 10 repetitions with consistent concordant symptoms, non worsening  Goal status: ONGOING  5. Pt will report at least 50% decrease in overall pain levels in past week in order to facilitate improved tolerance to basic ADLs/mobility.   Baseline: 0-8/10 in past week  Goal status: ONGOING  6. 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   Baseline: up to 8/10 at worst, pain increases with 1 hr sitting   Goal status: ONGOING  PLAN:  PT FREQUENCY:  2x/week   PT DURATION: 8 weeks  PLANNED INTERVENTIONS: Therapeutic exercises, Therapeutic activity, Neuromuscular re-education, Balance training, Gait training, Patient/Family education, Self Care, Joint mobilization, Joint manipulation, Stair training, Aquatic Therapy, Dry Needling, Electrical stimulation, Spinal manipulation, Spinal mobilization, Cryotherapy, Moist heat, Taping, Traction, Manual therapy, and Re-evaluation.  PLAN FOR NEXT SESSION: review/update  HEP PRN. Would likely benefit from trunk extensor endurance exercises, hip mobility and rotational strength. Manual PRN as indicate, pt interested in spinal manipulation, hip mobilization     Ashley Murrain PT, DPT 04/22/2023 2:46 PM

## 2023-04-28 NOTE — Therapy (Signed)
OUTPATIENT PHYSICAL THERAPY TREATMENT NOTE   Patient Name: Jay Huerta MRN: [id number] DOB:May 16, 1986, 37 y.o., male Today's Date: 04/28/2023  END OF SESSION:          No past medical history on file. Past Surgical History:  Procedure Laterality Date   INGUINAL HERNIA REPAIR     There are no problems to display for this patient.   PCP: no PCP in chart  REFERRING PROVIDER: Bedelia Person, MD  REFERRING DIAG: M54.9 (ICD-10-CM) - Dorsalgia, unspecified  Rationale for Evaluation and Treatment: Rehabilitation  THERAPY DIAG:  No diagnosis found.  ONSET DATE: since college  SUBJECTIVE:                                                                                                                                                                                           SUBJECTIVE STATEMENT: ***  *** No pain at present, just left his stretching appointment. Still having some groin pain at times. Notes his tolerance to basketball seems to be improving, getting stretches same day.    PERTINENT HISTORY:  No PMH in chart   PAIN:  Are you having pain: none Location/description: throbbing in tailbone and belt line Pain: 0/10  - aggravating factors: heavier activity, basketball, prolonged sitting >1hr  - Easing factors: massage, heat, stretching  PRECAUTIONS: None  WEIGHT BEARING RESTRICTIONS: No  FALLS:  Has patient fallen in last 6 months? 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   PLOF: Independent - active, enjoys sports  PATIENT GOALS: be able to manage pain better, get back and hips loosened up  NEXT MD VISIT: July   OBJECTIVE: (objective measures completed at initial evaluation unless otherwise dated)   DIAGNOSTIC FINDINGS:  Per review of paper referral (see media tab) no acute findings on lumbar XR   PATIENT SURVEYS:  Modified Oswestry: 19/50 raw  score, 38%  SCREENING FOR RED FLAGS: Red flag questioning/screening reassuring    COGNITION: Overall cognitive status: Within functional limits for tasks assessed     SENSATION/NEURO: Light touch intact all extremities   No clonus either LE   POSTURE: significantly rounded shoulders, hyperlordotic lumbar spine in sitting, tendency for increased kyphosis sitting  PALPATION: Tenderness to palpation BIL thoracolumbar paraspinals more tender approaching SIJ, most tender at distal QL attachments BIL and BIL glute/piriformis  LUMBAR ROM:   AROM eval 04/01/23  Flexion Mid shin * Toes   Extension 75% *  90%  Right lateral flexion Knee joint * Distal to knee joint  Left lateral flexion Knee joint *  Distal to knee joint  Right rotation 100% *   Left rotation 100% *     (Blank rows = not tested) (Key: WFL = within functional limits not formally assessed, * = concordant pain, s = stiffness/stretching sensation, NT = not tested)   LOWER EXTREMITY ROM:     Passive  Right eval Left eval  Hip flexion    Hip extension    Hip internal rotation    Hip external rotation    Knee extension    Knee flexion    (Blank rows = not tested) (Key: WFL = within functional limits not formally assessed, * = concordant pain, s = stiffness/stretching sensation, NT = not tested)  Comments: see hip testing below  LOWER EXTREMITY MMT:    MMT Right eval Left eval Right 04/01/23  Hip flexion 4+ groin pain 4+ 5  Hip abduction (modified sitting) 5 5   Hip internal rotation 4+ mild groin pain and lateral hip pain 5 4+, groin pain  Hip external rotation 4+ 5 5/5  Knee flexion 5 5   Knee extension 5 5   Ankle dorsiflexion 5 5    (Blank rows = not tested) (Key: WFL = within functional limits not formally assessed, * = concordant pain, s = stiffness/stretching sensation, NT = not tested)  Comments:    LUMBAR TESTS: Prone trunk extension repeated: elicits concordant symptoms non worsening over 10  repetitions   HIP TESTS: FABER + BIL FADDIR + R negative L Significant relief with hip distraction BIL Initial discomfort with PA SIJ mobilization but also describes as relieving   GAIT: Distance walked: within clinic Assistive device utilized: None Level of assistance: Complete Independence Comments: mechanics grossly WNL  TODAY'S TREATMENT:     [facility] Adult PT Treatment:                                                DATE: 04/29/23 Therapeutic Exercise: *** Manual Therapy: *** Neuromuscular re-ed: *** Therapeutic Activity: *** Modalities: *** Self Care: ***    [facility] Adult PT Treatment:                                                DATE: 04/22/23 Therapeutic Exercise: Henreitta Leber + black band abduction 2x15 cues  SL bridge 2x8 BIL cues for pacing and form Cybex hip extension 30# x8 BIL, 42.5# x8 BIL, 50# x8 BIL cues for posture Hex bar RDL x12 (just bar), x8 with 25# each side cues for hip shift and posture, truncal mechanics SL RDL w/ contralat UE support 2x8 cues for form and mechanics CC paloff press 13# x8 BIL, cues for posture CC paloff press kickstand (IR bias) 7# BIL cues for posture and form HEP update + education  [facility] Adult PT Treatment:                                                DATE: 04/10/2023  Therapeutic Exercise:  90/90 heel taps with 6# ankle weight, 2 x 15 Glute bridges x 10, with black TB for hip abduction 2 x 15 SLR with  contralateral pilates ring push for TA activation 2x10 BIL, with 6# ankle weight Sidelying hip abduction 6# ankle, 2 x 15 Hooklying pilates ring squeeze, 5 sec x 20 Prone hip extension, 2 x 10  Supine R hip adduction with black theraband, 2 x 10  Standing Chops on foam with cc #20, 2 x 15 each side  Anti-rotation walk outs, 20# cable column, x15 each side RDLs with 20# KB x 12 bilaterally   [facility] Adult PT Treatment:                                                DATE: 04/08/2023  Therapeutic Exercise: 90/90 heel taps with 5#, 3  x 10 Glute bridges x 10, with blue TB for hip abduction 2 x 15 SLR with contralateral pilates ring push for TA activation 2x10 BIL, with 5# ankle weight Sidelying hip abduction 5#, 3 x 10  Seated pilates ring squeeze, 5 sec x 15  Standing Chops on foam with cc #17, 2 x 15 each side  Anti-rotation walk outs, 17# cable column, 2 x 10 each side   [facility] Adult PT Treatment:                                                DATE: 04/01/2023  Therapeutic Exercise: 90/90 heel taps with 5#, 2 x 15 SLR with contralateral pilates ring push for TA activation 2x15 BIL Sidelying hip abduction 5#, 2 x 15  Seated pilates ring squeeze, 5 sec x 30 Anti-rotation walk outs, 17# cable column x 10 each side  Therapeutic Activity:  Reassessment of objective measures and subjective progress towards established goals Patient education regarding progress, goals for remaining POC, importance of incorporating more frequent strength and flexibility into independent home program.    [facility] Adult PT Treatment:                                                DATE: 03/27/2023   Therapeutic Exercise: 90/90 heel taps with 5#, 2 x 10 SLR with contralateral pilates ring push for TA activation 3x10 BIL Sidelying hip abduction 5#, 2 x 15  R sidelying hip adduction with blue theraband, 2 x 10  Seated pilates ring squeeze, 5 sec, 2 x 10  Therapeutic Activity: Reviewed progress towards goals     PATIENT EDUCATION:  Education details: rationale for interventions, HEP update, relevant anatomy/physiology  Person educated: Patient Education method: Explanation, Demonstration, Tactile cues, Verbal cues, and Handouts Education comprehension: verbalized understanding, returned demonstration, verbal cues required, tactile cues required, and needs further education    HOME EXERCISE PROGRAM: Access Code: [email or web address] URL: [email or web address] Date: 04/22/2023 Prepared by: Fransisco Hertz  Exercises - Prone Press Up On  Elbows  - 1 x daily - 7 x weekly - 2-3 sets - 10 reps - Supine 90/90 Alternating Toe Touch  - 1 x daily - 7 x weekly - 2 sets - 10 reps - Standing Diagonal Chop  - 1 x daily - 7 x weekly - 2 sets - 10 reps - Single Leg Bridge  - 1 x daily -  7 x weekly - 2 sets - 8 reps - Forward T with Counter Support  - 1 x daily - 7 x weekly - 2 sets - 8 reps  ASSESSMENT:  CLINICAL IMPRESSION: 04/28/2023 ***  *** Pt arrives w/o pain, notes some improvement in tolerance to heavier activities since start of care but also mentions he has been scheduling stretching appointments on his game days. Continuing to progress for functional strengthening and increased demand on hip rotators/core musculature. Pt tolerates well and denies any pain or significant fatigue, no adverse events. HEP update as above. Recommend looking at goals next session. Pt departs today's session in no acute distress, all voiced questions/concerns addressed appropriately from PT perspective.       OBJECTIVE IMPAIRMENTS: decreased activity tolerance, decreased endurance, decreased mobility, decreased ROM, decreased strength, hypomobility, impaired perceived functional ability, postural dysfunction, and pain.   ACTIVITY LIMITATIONS: carrying, lifting, sitting, and standing  PARTICIPATION LIMITATIONS: driving and community activity  PERSONAL FACTORS: Time since onset of injury/illness/exacerbation are also affecting patient's functional outcome.   REHAB POTENTIAL: Good  CLINICAL DECISION MAKING: Stable/uncomplicated  EVALUATION COMPLEXITY: Low   GOALS: Goals reviewed with patient? Yes  SHORT TERM GOALS: Target date: 03/21/2023  Pt will demonstrate appropriate understanding and performance of initially prescribed HEP in order to facilitate improved independence with management of symptoms.  Baseline: HEP provided on eval Goal status: MET  2. Pt will score less than or equal to 27% on Modified Oswestry in order to demonstrate  improved perception of function due to symptoms.  Baseline: 38%  03/27/23: 28%  Goal status: Partially Met  LONG TERM GOALS: Target date: 04/26/2023, updated as of 04/01/23 Pt will score  less than or equal to 19% on Modified Oswestry in order to demonstrate improved perception of functional status due to symptoms.  Baseline: 38% 03/27/23: 28% 04/29/23: ***  Goal status: ***   2.  Pt will demonstrate symmetrical and painless lumbar rotation AROM in order to demonstrate improved tolerance to functional movement patterns.  Baseline: see ROM chart above Goal status: MET  3.  Pt will demonstrate hip ER/IR MMT of 5/5 BIL without pain in order to demonstrate improved strength for functional movements.  Baseline: see MMT chart above Goal status: MET  4. Pt will perform 20 repetitions of repeated trunk extensions in prone without reproduction of pain in order to facilitate improved postural endurance for sport activities.  Baseline: 10 repetitions with consistent concordant symptoms, non worsening  04/29/23: ***   Goal status: ***   5. Pt will report at least 50% decrease in overall pain levels in past week in order to facilitate improved tolerance to basic ADLs/mobility.   Baseline: 0-8/10 in past week  04/29/23: ***   Goal status: ***   6. 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   Baseline: up to 8/10 at worst, pain increases with 1 hr sitting   04/29/23: ***   Goal status: ***   PLAN:  PT FREQUENCY: 2x/week   PT DURATION: 8 weeks  PLANNED INTERVENTIONS: Therapeutic exercises, Therapeutic activity, Neuromuscular re-education, Balance training, Gait training, Patient/Family education, Self Care, Joint mobilization, Joint manipulation, Stair training, Aquatic Therapy, Dry Needling, Electrical stimulation, Spinal manipulation, Spinal mobilization,  Cryotherapy, Moist heat, Taping, Traction, Manual therapy, and Re-evaluation.  PLAN FOR NEXT SESSION: review/update HEP PRN. Would likely benefit from trunk extensor  endurance exercises, hip mobility and rotational strength. Manual PRN as indicate, pt interested in spinal manipulation, hip mobilization  ***    Ashley Murrain PT, DPT 04/28/2023 10:29 AM

## 2023-04-29 ENCOUNTER — Ambulatory Visit: Payer: Medicaid Other | Admitting: Physical Therapy

## 2023-04-29 ENCOUNTER — Encounter: Payer: Self-pay | Admitting: Physical Therapy

## 2023-04-29 DIAGNOSIS — M25652 Stiffness of left hip, not elsewhere classified: Secondary | ICD-10-CM

## 2023-04-29 DIAGNOSIS — M5459 Other low back pain: Secondary | ICD-10-CM

## 2023-04-29 DIAGNOSIS — M25651 Stiffness of right hip, not elsewhere classified: Secondary | ICD-10-CM | POA: Diagnosis not present

## 2023-05-01 ENCOUNTER — Ambulatory Visit: Payer: Medicaid Other | Admitting: Physical Therapy

## 2023-05-01 ENCOUNTER — Telehealth: Payer: Self-pay | Admitting: Physical Therapy

## 2023-05-01 NOTE — Telephone Encounter (Signed)
Called pt re: this afternoon's missed appt - states he thought reduction in frequency of PT started this week. confirmed date/time of next appt

## 2023-05-08 ENCOUNTER — Ambulatory Visit: Payer: Medicaid Other | Admitting: Physical Therapy

## 2023-05-13 ENCOUNTER — Ambulatory Visit: Payer: Medicaid Other | Admitting: Physical Therapy

## 2023-05-22 ENCOUNTER — Ambulatory Visit: Payer: Medicaid Other | Attending: Neurosurgery

## 2023-05-22 DIAGNOSIS — M25652 Stiffness of left hip, not elsewhere classified: Secondary | ICD-10-CM | POA: Diagnosis present

## 2023-05-22 DIAGNOSIS — M5459 Other low back pain: Secondary | ICD-10-CM | POA: Insufficient documentation

## 2023-05-22 DIAGNOSIS — M25651 Stiffness of right hip, not elsewhere classified: Secondary | ICD-10-CM | POA: Diagnosis present

## 2023-05-22 NOTE — Therapy (Signed)
OUTPATIENT PHYSICAL THERAPY PROGRESS NOTE/RECERTIFICATION and DISCHARGE SUMMARY     Patient Name: Jay Huerta MRN: [id number] DOB:1986/03/21, 37 y.o., male Today's Date: 05/22/2023  END OF SESSION:  PT End of Session - 05/22/23 1414     Visit Number 11    Number of Visits 17    Authorization Type [employer] healthy blue    PT Start Time 1416    PT Stop Time 1443    PT Time Calculation (min) 27 min    Activity Tolerance Patient tolerated treatment well;No increased pain    Behavior During Therapy [facility] for tasks assessed/performed               No past medical history on file. Past Surgical History:  Procedure Laterality Date   INGUINAL HERNIA REPAIR     There are no problems to display for this patient.    PROGRESS NOTE   See note below for Objective Data and Assessment of Progress/Goals.    PHYSICAL THERAPY DISCHARGE SUMMARY  Visits from Start of Care: 11  Current functional level related to goals / functional outcomes: See objective findings/assessment    Remaining deficits: See objective findings/assessment   Education / Equipment: See today's treatment/assessment    Patient agrees to discharge. Patient goals were partially met. Patient is being discharged due to maximized rehab potential.     PCP: [facility]   REFERRING PROVIDER: Bedelia Person, MD  REFERRING DIAG: M54.9 (ICD-10-CM) - Dorsalgia, unspecified  Rationale for Evaluation and Treatment: Rehabilitation  THERAPY DIAG:  Stiffness of right hip, not elsewhere classified  Stiffness of left hip, not elsewhere classified  Other low back pain  ONSET DATE: since college  SUBJECTIVE:                                                                                                                                                                                           SUBJECTIVE STATEMENT: Bergen reports back to PT after 3 weeks of since the last treatment  session/recertification.  He reports that he has not been compliant with home exercise program, and has sustained new injury to the left hamstring, and reinjury of her right groin.  He is stating that his groin pain and hamstring pain seem to be more limiting than lower back pain at this time.  Patient agrees to discharge from skilled therapy at this time, with expectation of returning with new referral for new injuries.  He has paused his appointments with [facility], until evaluation from physician for hamstring and groin.   PERTINENT HISTORY:  No PMH in chart   PAIN:  Are  you having pain: none Location/description: throbbing in tailbone and belt line Pain: 2-3/10  - aggravating factors: heavier activity, basketball, prolonged sitting >1hr  - Easing factors: massage, heat, stretching  PRECAUTIONS: None  WEIGHT BEARING RESTRICTIONS: No  FALLS:  Has patient fallen in last 6 months? 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   PLOF: Independent - active, enjoys sports  PATIENT GOALS: be able to manage pain better, get back and hips loosened up  NEXT MD VISIT: July 12th  OBJECTIVE: (objective measures completed at initial evaluation unless otherwise dated)   DIAGNOSTIC FINDINGS:  Per review of paper referral (see media tab) no acute findings on lumbar XR   PATIENT SURVEYS:  Modified Oswestry: 19/50 raw score, 38% 04/29/23 Modified Oswestry: 13/50 raw score, 26%  SCREENING FOR RED FLAGS: Red flag questioning/screening reassuring    COGNITION: Overall cognitive status: Within functional limits for tasks assessed     SENSATION/NEURO: Light touch intact all extremities   No clonus either LE   POSTURE: significantly rounded shoulders, hyperlordotic lumbar spine in sitting, tendency for increased kyphosis sitting  PALPATION: Tenderness to palpation BIL thoracolumbar paraspinals more  tender approaching SIJ, most tender at distal QL attachments BIL and BIL glute/piriformis  LUMBAR ROM:   AROM eval 04/01/23 05/22/23  Flexion Mid shin * Toes  Finger tips to Ground   Extension 75% *  90% 100%  Right lateral flexion Knee joint * Distal to knee joint Distal to knee joint  Left lateral flexion Knee joint *  Distal to knee joint Disorder no swelling  Right rotation 100% *    Left rotation 100% *      (Blank rows = not tested) (Key: WFL = within functional limits not formally assessed, * = concordant pain, s = stiffness/stretching sensation, NT = not tested)   LOWER EXTREMITY ROM:     Comments: see hip testing below  LOWER EXTREMITY MMT:    MMT Right eval Left eval Right 04/01/23  Hip flexion 4+ groin pain 4+ 5  Hip abduction (modified sitting) 5 5   Hip internal rotation 4+ mild groin pain and lateral hip pain 5 4+, groin pain  Hip external rotation 4+ 5 5/5  Knee flexion 5 5   Knee extension 5 5   Ankle dorsiflexion 5 5    (Blank rows = not tested) (Key: WFL = within functional limits not formally assessed, * = concordant pain, s = stiffness/stretching sensation, NT = not tested)  Comments:    LUMBAR TESTS: Prone trunk extension repeated: elicits concordant symptoms non worsening over 10 repetitions  04/29/23: prone repeated extensions, fatigue at 12 repetitions but able to perform 20 repetitions   HIP TESTS: FABER + BIL FADDIR + R negative L Significant relief with hip distraction BIL Initial discomfort with PA SIJ mobilization but also describes as relieving   GAIT: Distance walked: within clinic Assistive device utilized: None Level of assistance: Complete Independence Comments: mechanics grossly WNL   TODAY'S TREATMENT:       [facility] Adult PT Treatment:                                                DATE: 05/22/2023  Therapeutic Exercise: Side-lying clamshells, 2 x 10 bilateral Side-lying reverse clamshells, 2 x 10 bilateral  Updated home exercise  program and reviewed  Therapeutic Activity:  Reassessment of objective measures and subjective assessment regarding progress towards established goals and plan for independence with prescribed home program following discharged from PT  Patient regarding importance of compliance with home exercise program, strategies for incorporating exercises into daily routine, use of physical therapy for rehabilitation purposes, and home exercise program for maintenance as indicated.   [facility] Adult PT Treatment:                                                DATE: 04/29/23 Therapeutic Exercise: Side plank hold 2x30sec BIL cues for hip positioning and shoulder positioning  Standing red band hip flexion marches 3x8 with UE support  Prone superman 2x20 w/ rest HEP update + review  Therapeutic Activity: Repeated prone extension test + education ODI + education Significant time spent with progress note activities - education/discussion re: progress with PT thus far, symptom behavior as it affects activity tolerance, PT goals/POC   PATIENT EDUCATION:  Education details: See above  person educated: Patient Education method: Explanation, Demonstration, Tactile cues, Verbal cues, and Handouts Education comprehension: verbalized understanding, returned demonstration, verbal cues required, tactile cues required, and needs further education    HOME EXERCISE PROGRAM: Access Code: [email or web address] URL: [email or web address] Date: 05/23/2023 Prepared by: Mauri Reading  Exercises - Standing Diagonal Chop  - 1 x daily - 7 x weekly - 2 sets - 10 reps - Forward T with Counter Support  - 1 x daily - 7 x weekly - 2 sets - 8 reps - Prone Press Up On Elbows  - 1 x daily - 7 x weekly - 2-3 sets - 10 reps - Single Leg Bridge  - 1 x daily - 7 x weekly - 2 sets - 8 reps - Side Plank on Elbow  - 1 x daily - 7 x weekly - 2 sets - 1 reps - 30sec hold - Clamshell  - 1 x daily - 7 x weekly - 2 sets - 10 reps - Sidelying  Reverse Clamshell  - 1 x daily - 7 x weekly - 2 sets - 10 reps  ASSESSMENT:  CLINICAL IMPRESSION: Patient returns to physical therapy 3 weeks after last progress note assessment, with no significant change in low back symptoms.  He has demonstrated improved lumbar mobility since initial evaluation, but denies improvements in overall functional capacity.  Patient will benefit from compliance with prescribed home program to maximize rehab outcomes following discharge from skilled physical therapy today.  Spent extended time providing patient education today regarding his response to physical therapy intervention, and importance of home exercise program.  Patient is being discharged from skilled physical therapy at this time, and is expected to return with new physician referral for likely hamstring strain and a groin strain.    OBJECTIVE IMPAIRMENTS: decreased activity tolerance, decreased endurance, decreased mobility, decreased ROM, decreased strength, hypomobility, impaired perceived functional ability, postural dysfunction, and pain.   ACTIVITY LIMITATIONS: carrying, lifting, sitting, and standing  PARTICIPATION LIMITATIONS: driving and community activity  PERSONAL FACTORS: Time since onset of injury/illness/exacerbation are also affecting patient's functional outcome.   REHAB POTENTIAL: Good  CLINICAL DECISION MAKING: Stable/uncomplicated  EVALUATION COMPLEXITY: Low   GOALS: Goals reviewed with patient? Yes  SHORT TERM GOALS: Target date: 03/21/2023  Pt will demonstrate appropriate understanding and performance of initially prescribed HEP  in order to facilitate improved independence with management of symptoms.  Baseline: HEP provided on eval Goal status: MET  2. Pt will score less than or equal to 27% on Modified Oswestry in order to demonstrate improved perception of function due to symptoms.  Baseline: 38%  03/27/23: 28%  Goal status: Partially Met  LONG TERM GOALS: Target  date: 05/27/2023 (updated 04/29/23)   Pt will score  less than or equal to 19% on Modified Oswestry in order to demonstrate improved perception of functional status due to symptoms.  Baseline: 38% 03/27/23: 28% 04/29/23: 26%  Goal status: NOT MET  2.  Pt will demonstrate symmetrical and painless lumbar rotation AROM in order to demonstrate improved tolerance to functional movement patterns.  Baseline: see ROM chart above Goal status: MET  3.  Pt will demonstrate hip ER/IR MMT of 5/5 BIL without pain in order to demonstrate improved strength for functional movements.  Baseline: see MMT chart above Goal status: MET  4. Pt will perform 20 repetitions of repeated trunk extensions in prone without reproduction of pain in order to facilitate improved postural endurance for sport activities.  Baseline: 10 repetitions with consistent concordant symptoms, non worsening  04/29/23: 20 repetitions, fatigue at rep 12   Goal status: NOT MET  5. Pt will report at least 50% decrease in overall pain levels in past week in order to facilitate improved tolerance to basic ADLs/mobility.   Baseline: 0-8/10 in past week  04/29/23: 0-7/10 in past week, mostly groin   Goal status: MET, worst pain is currently in Rt groin and Lt hamstring   6. 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   Baseline: up to 8/10 at worst, pain increases with 1 hr sitting   04/29/23: still having pain with prolonged sitting, ~2 hrs with 6/10 pain  Goal status: PARTIALLY MET    PT FREQUENCY: 1x/week   PT DURATION: 4 weeks  PLANNED INTERVENTIONS: Therapeutic exercises, Therapeutic activity, Neuromuscular re-education, Balance training, Gait training, Patient/Family education, Self Care, Joint mobilization, Joint manipulation, Stair training, Aquatic Therapy, Dry Needling, Electrical stimulation, Spinal  manipulation, Spinal mobilization, Cryotherapy, Moist heat, Taping, Traction, Manual therapy, and Re-evaluation.  PLAN FOR NEXT SESSION: review/update HEP PRN. Continue to work on lumbopelvic endurance and strength, emphasis on hip flexors and adductors, lumbar extensor endurance   Mauri Reading, PT, DPT  05/23/2023 8:39 AM    Check all possible CPT codes: 11914- Therapeutic Exercise, and 97530 - Therapeutic Activities    Check all conditions that are expected to impact treatment: {Conditions expected to impact treatment:None of these apply   If treatment provided at initial evaluation, no treatment charged due to lack of authorization.

## 2023-05-27 ENCOUNTER — Ambulatory Visit: Payer: Medicaid Other

## 2023-08-01 ENCOUNTER — Ambulatory Visit: Payer: Medicaid Other | Attending: Neurosurgery | Admitting: Physical Therapy

## 2023-08-01 ENCOUNTER — Other Ambulatory Visit: Payer: Self-pay

## 2023-08-01 ENCOUNTER — Encounter: Payer: Self-pay | Admitting: Physical Therapy

## 2023-08-01 DIAGNOSIS — M6281 Muscle weakness (generalized): Secondary | ICD-10-CM | POA: Diagnosis present

## 2023-08-01 DIAGNOSIS — M25551 Pain in right hip: Secondary | ICD-10-CM | POA: Insufficient documentation

## 2023-08-01 NOTE — Therapy (Signed)
OUTPATIENT PHYSICAL THERAPY LOWER EXTREMITY EVALUATION  Patient Name: Jay Huerta MRN: [id number] DOB:Mar 14, 1986, 37 y.o., male Today's Date: 08/01/2023   PT End of Session - 08/01/23 1110     Visit Number 1    Number of Visits --   1-2x/week   Date for PT Re-Evaluation 09/26/23    Authorization Type [employer] healthy blue - LEFS    PT Start Time 0745    PT Stop Time 0826    PT Time Calculation (min) 41 min             History reviewed. No pertinent past medical history. Past Surgical History:  Procedure Laterality Date   INGUINAL HERNIA REPAIR     There are no problems to display for this patient.   PCP: Patient, No Pcp Per  REFERRING PROVIDER: Loney Laurence, NP  THERAPY DIAG:  Pain in right hip - Plan: PT plan of care cert/re-cert  Muscle weakness - Plan: PT plan of care cert/re-cert  REFERRING DIAG: Right lower quadrant pain [R10.31]   Rationale for Evaluation and Treatment:  Rehabilitation  SUBJECTIVE:  PERTINENT PAST HISTORY:  none        PRECAUTIONS: None  WEIGHT BEARING RESTRICTIONS No  FALLS:  Has patient fallen in last 6 months? No, Number of falls: 0  MOI/History of condition:  Onset date: 3 months  SUBJECTIVE STATEMENT  Jay Huerta is a 37 y.o. male who presents to clinic with chief complaint of R groin pain which started after playing basketball.  This occurred about 3 months ago.  He had a subsequent slip and re-aggravation of this injury about a month ago.  He has not played basketball since that time.  His pain is minimal at this time, but he want to make sure he is safe returning to basketball and working out.  He has not been stretching or working out for ~ 1 month.   Red flags:  denies   Pain:  Are you having pain? Yes Pain location: R groin pain NPRS scale:  0/10 to 3/10 Aggravating factors: more aggressive physical activity Relieving factors: rest Pain description: dull and aching Stage: Chronic 24 hour pattern:  worse with activity   Occupation: emergency medical transportation  Assistive Device: na  Hand Dominance: na  Patient Goals/Specific Activities: return to basketball and working out   OBJECTIVE:   DIAGNOSTIC FINDINGS:  NA   GENERAL OBSERVATION/GAIT:   NA  SENSATION:  Light touch: Appears intact  PALPATION: Minimal TTP  MUSCLE LENGTH: Hamstrings: Right subtle restriction; Left significant restriction  LE MMT:  MMT Right (Eval) Left (Eval)  Hip flexion (L2, L3) 5 5  Knee extension (L3) 5 5  Knee flexion 5 5  Hip abduction 4+ 4+  Hip extension 4+ 5  Hip external rotation 4 5  Hip internal rotation 4 5  Hip adduction 3+* 4+  Ankle dorsiflexion (L4)    Ankle plantarflexion (S1)    Ankle inversion    Ankle eversion    Great Toe ext (L5)    Grossly     (Blank rows = not tested, score listed is out of 5 possible points.  N = WNL, D = diminished, C = clear for gross weakness with myotome testing, * = concordant pain with testing)  LE ROM:  ROM Right (Eval) Left (Eval)  Hip flexion    Hip extension    Hip abduction    Hip adduction    Hip internal rotation    Hip external  rotation    Knee extension    Knee flexion    Ankle dorsiflexion    Ankle plantarflexion    Ankle inversion    Ankle eversion     (Blank rows = not tested, N = WNL, * = concordant pain with testing)  Functional Tests  Eval (08/01/2023)    Balance - SLS on foam with contralateral limb movement - more difficult L than R    SL squat  - dynamic knee valgus bil                                                      PATIENT SURVEYS:  LEFS 51/80   TODAY'S TREATMENT: Creating, reviewing, and completing below HEP   PATIENT EDUCATION:  POC, diagnosis, prognosis, HEP, and outcome measures.  Pt educated via explanation, demonstration, and handout (HEP).  Pt confirms understanding verbally.   HOME EXERCISE PROGRAM: Access Code: [email or web address] URL:  [email or web address] Date: 08/01/2023 Prepared by: Alphonzo Severance  Exercises - Side Lunge Adductor Stretch  - 1 x daily - 7 x weekly - 1 sets - 3 reps - 45 second hold - Clamshell with Resistance  - 1 x daily - 7 x weekly - 3 sets - 10 - 15 reps - Sidelying Hip Adduction  - 1 x daily - 7 x weekly - 1 sets - 10 reps - 5-10 second hold  Treatment priorities   Eval        R hip strength with main emphasis on adduction        Knee control with squatting        HS length                          ASSESSMENT:  CLINICAL IMPRESSION: Jay Huerta is a 37 y.o. male who presents to clinic with signs and sxs consistent with R hip pain secondary to strain sustained ~3 months ago during basketball with re-injury ~1 month ago.  He has significantly reduced his activity level for about 1 month and is now only having pain with more aggressive activities.  We discussed return to sport plan including strength foundation, controlled polymeric workouts, then return to basketball  OBJECTIVE IMPAIRMENTS: Pain, hip ROM, R hip strength  ACTIVITY LIMITATIONS: squatting, recreation  PERSONAL FACTORS: See medical history and pertinent history   REHAB POTENTIAL: Good  CLINICAL DECISION MAKING: Stable/uncomplicated  EVALUATION COMPLEXITY: Low   GOALS:   SHORT TERM GOALS: Target date: 08/29/2023  Jay Huerta will be >75% HEP compliant to improve carryover between sessions and facilitate independent management of condition  Evaluation: ongoing Goal status: INITIAL   LONG TERM GOALS: Target date: 09/26/2023   Jay Huerta will show a >/= 18 pt improvement in LEFS score (MCID is ~11% or 9 pts) as a proxy for functional improvement   Evaluation/Baseline: 51/80 pts Goal status: INITIAL   2.  Jay Huerta will be able to return to basket ball with <3/10 pain  Evaluation/Baseline: not currently able to participate Goal status: INITIAL   3.  Jay Huerta will report confidence in self management of  condition at time of discharge with advanced HEP  Evaluation/Baseline: unable to self manage Goal status: INITIAL   4.  Jay Huerta will improve the following MMTs to >/= 4+/5 to show improvement in strength:     Evaluation/Baseline:  LE MMT:  MMT Right (Eval) Left (Eval)  Hip flexion (L2, L3) 5 5  Knee extension (L3) 5 5  Knee flexion 5 5  Hip abduction 4+ 4+  Hip extension 4+ 5  Hip external rotation 4 5  Hip internal rotation 4 5  Hip adduction 3+* 4+  Ankle dorsiflexion (L4)    Ankle plantarflexion (S1)    Ankle inversion    Ankle eversion    Great Toe ext (L5)    Grossly     (Blank rows = not tested, score listed is out of 5 possible points.  N = WNL, D = diminished, C = clear for gross weakness with myotome testing, * = concordant pain with testing)  Goal status: INITIAL   5.  Jay Huerta will be able to perform SL squat to tolerance with minimal dynamic valgus  Evaluation/Baseline: significant valgus Goal status: INITIAL    PLAN: PT FREQUENCY: 1-2x/week  PT DURATION: 8 weeks  PLANNED INTERVENTIONS: Therapeutic exercises, Aquatic therapy, Therapeutic activity, Neuro Muscular re-education, Gait training, Patient/Family education, Joint mobilization, Dry Needling, Electrical stimulation, Spinal mobilization and/or manipulation, Moist heat, Taping, Vasopneumatic device, Ionotophoresis 4mg /ml Dexamethasone, and Manual therapy   Alphonzo Severance PT, DPT 08/01/2023, 11:27 AM

## 2023-08-15 ENCOUNTER — Telehealth: Payer: Self-pay | Admitting: Physical Therapy

## 2023-08-15 ENCOUNTER — Ambulatory Visit: Payer: Medicaid Other | Admitting: Physical Therapy

## 2023-08-15 NOTE — Telephone Encounter (Signed)
Called pt re: this morning's missed appt - states he got reminder for Monday's appointment and didn't realize he had one this morning. Advised on attendance policy and confirmed date/time of next appt

## 2023-08-18 ENCOUNTER — Ambulatory Visit: Payer: Medicaid Other | Admitting: Physical Therapy

## 2023-08-28 ENCOUNTER — Ambulatory Visit: Payer: Medicaid Other | Admitting: Physical Therapy

## 2024-03-24 ENCOUNTER — Other Ambulatory Visit: Payer: Self-pay | Admitting: Medical Genetics

## 2024-07-06 ENCOUNTER — Encounter (HOSPITAL_BASED_OUTPATIENT_CLINIC_OR_DEPARTMENT_OTHER): Payer: Self-pay | Admitting: Internal Medicine

## 2024-07-06 DIAGNOSIS — G4733 Obstructive sleep apnea (adult) (pediatric): Secondary | ICD-10-CM

## 2024-07-26 ENCOUNTER — Encounter (HOSPITAL_BASED_OUTPATIENT_CLINIC_OR_DEPARTMENT_OTHER): Admitting: Internal Medicine

## 2024-07-30 ENCOUNTER — Other Ambulatory Visit: Payer: Self-pay | Admitting: Medical Genetics

## 2024-07-30 DIAGNOSIS — Z006 Encounter for examination for normal comparison and control in clinical research program: Secondary | ICD-10-CM

## 2024-09-03 ENCOUNTER — Ambulatory Visit: Admitting: Family Medicine

## 2024-09-03 ENCOUNTER — Encounter: Payer: Self-pay | Admitting: Family Medicine

## 2024-09-03 ENCOUNTER — Ambulatory Visit

## 2024-09-03 ENCOUNTER — Other Ambulatory Visit: Payer: Self-pay

## 2024-09-03 VITALS — BP 136/88 | HR 63 | Ht 77.0 in | Wt 249.0 lb

## 2024-09-03 DIAGNOSIS — M25561 Pain in right knee: Secondary | ICD-10-CM

## 2024-09-03 DIAGNOSIS — M79641 Pain in right hand: Secondary | ICD-10-CM

## 2024-09-03 DIAGNOSIS — M25551 Pain in right hip: Secondary | ICD-10-CM

## 2024-09-03 DIAGNOSIS — M79642 Pain in left hand: Secondary | ICD-10-CM

## 2024-09-03 DIAGNOSIS — M545 Low back pain, unspecified: Secondary | ICD-10-CM

## 2024-09-03 DIAGNOSIS — M25552 Pain in left hip: Secondary | ICD-10-CM

## 2024-09-03 DIAGNOSIS — G8929 Other chronic pain: Secondary | ICD-10-CM

## 2024-09-03 DIAGNOSIS — M2550 Pain in unspecified joint: Secondary | ICD-10-CM

## 2024-09-03 NOTE — Patient Instructions (Addendum)
 Thank you for coming in today.   Please get an Xray today before you leave   A referral for physical therapy has been submitted. A representative from the physical therapy office will contact you to coordinate scheduling after confirming your benefits with your insurance provider. If you do not hear from the physical therapy office within the next 1-2 weeks, please let us  know.   See you back in 2 months

## 2024-09-03 NOTE — Progress Notes (Signed)
 I, Leotis Batter, CMA acting as a scribe for Artist Lloyd, MD.  Jay Huerta is a 38 y.o. male who presents to [facility] at [facility] today for LBP x 20 years. Pt locates pain to bilateral lower back pain, worse after prolonged activity or prolonged standing. C/o tightness in both hips.  Additionally he has bilateral hand pain and right knee pain.  He played college basketball and semipro ball for years and is still playing basketball recreationally at a high level.  Radiating pain: into hips and B LE LE numbness/tingling: denies LE weakness: denies Aggravates: prolonged activity Treatments tried: muscle relaxer's, indomethacin, Tylenol  Arthritis, Icy Hot  Also c/o right knee pain after playing basketball. Frequent popping in the knees, restricted ROM.   Dx imaging: 03/27/15 L-spine XR  Pertinent review of systems: No fevers or chills  Relevant historical information: Elevated blood pressure   Exam:  BP 136/88   Pulse 63   Ht 6' 5 (1.956 m)   Wt 249 lb (112.9 kg)   SpO2 98%   BMI 29.53 kg/m  General: Well Developed, well nourished, and in no acute distress.   MSK: Hands bilaterally significant degenerative changes and prior injuries.  Patient has multiple mallet fingers and swelling and bossing of MCPs especially bilateral thumb MCPs.  Patient does have full grip motion and strength.  L-spine nontender to palpation midline decreased lumbar motion lower extremity strength is intact.  Bilateral hips normal motion.  Right knee normal-appearing normal motion intact strength.  Stable ligamentous exam.    Lab and Radiology Results  X-ray images bilateral hands lumbar spine pelvis and right knee obtained today personally and independently interpreted.  Right hand: Mild degenerative changes.  No severe wrist DJD.  No acute fractures.  Left hand: Degenerative changes of the left wrist radiocarpal joint.  Mild degenerative changes into the hand.  No  acute fractures are visible.  Lumbar spine: Mild DDD L5-S1 and some facet DJD L5-S1.  No acute fractures are visible.  Pelvis: Mild degenerative changes bilateral hips right worse than left.  No acute fractures are visible.  Right knee: Mild degenerative changes.  Sclerosis or increased density present at lateral tibia or fibula.  Question benign cartilage growth.  Await formal radiology review     Assessment and Plan: 38 y.o. male with multilevel pain.  Patient has participated in sports for a long time and has had multiple injuries as a result.  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  He also has chronic back pain hip pain and right knee pain.  He does have degenerative changes in all of these areas.  He is a good candidate for physical therapy.  Plan to refer to PT.  Consider occupational therapy for his hands as well.  Plan to recheck in 2 months.  Await radiology overread.   PDMP not reviewed this encounter. Orders Placed This Encounter  Procedures   DG Lumbar Spine 2-3 Views    Standing Status:   Future    Number of Occurrences:   1    Expiration Date:   10/03/2024    Reason for Exam (SYMPTOM  OR DIAGNOSIS REQUIRED):   chronic LBP    Preferred imaging location?:   [place] [facility]   DG Knee AP/LAT W/[place] Right    Standing Status:   Future    Number of Occurrences:   1    Expiration Date:  10/03/2024    Reason for Exam (SYMPTOM  OR DIAGNOSIS REQUIRED):   chronic R knee pain    Preferred imaging location?:   [facility] Hand Complete Left    Standing Status:   Future    Number of Occurrences:   1    Expiration Date:   10/03/2024    Reason for Exam (SYMPTOM  OR DIAGNOSIS REQUIRED):   left hand pain    Preferred imaging location?:   [facility]   DG Hand Complete Right    Standing Status:   Future    Number of Occurrences:   1    Expiration Date:   10/03/2024    Reason  for Exam (SYMPTOM  OR DIAGNOSIS REQUIRED):   right hand pain    Preferred imaging location?:   [place] [facility]   DG Pelvis 1-2 Views    Standing Status:   Future    Number of Occurrences:   1    Expiration Date:   09/03/2025    Reason for Exam (SYMPTOM  OR DIAGNOSIS REQUIRED):   hip pain    Preferred imaging location?:   [place] [facility]   Ambulatory referral to Physical Therapy    Referral Priority:   Routine    Referral Type:   Physical Medicine    Referral Reason:   Specialty Services Required    Requested Specialty:   Physical Therapy    Number of Visits Requested:   1   No orders of the defined types were placed in this encounter.    Discussed warning signs or symptoms. Please see discharge instructions. Patient expresses understanding.   The above documentation has been reviewed and is accurate and complete Artist Lloyd, M.D.

## 2024-09-06 ENCOUNTER — Ambulatory Visit: Payer: Self-pay | Admitting: Family Medicine

## 2024-09-06 DIAGNOSIS — M2550 Pain in unspecified joint: Secondary | ICD-10-CM

## 2024-09-06 DIAGNOSIS — N200 Calculus of kidney: Secondary | ICD-10-CM

## 2024-09-06 DIAGNOSIS — M25551 Pain in right hip: Secondary | ICD-10-CM

## 2024-09-06 DIAGNOSIS — G8929 Other chronic pain: Secondary | ICD-10-CM

## 2024-09-06 DIAGNOSIS — M79641 Pain in right hand: Secondary | ICD-10-CM

## 2024-09-06 NOTE — Progress Notes (Signed)
 Right hand x-ray shows chronic old fractures of the index finger and middle finger

## 2024-09-06 NOTE — Progress Notes (Signed)
 Low back x-ray shows a tiny bit of scoliosis.  No severe arthritis or broken bones.  Possible right-sided kidney stone.

## 2024-09-06 NOTE — Progress Notes (Signed)
 Left hand x-ray shows arthritis into the wrist and some arthritis into the hands.

## 2024-09-06 NOTE — Progress Notes (Signed)
 Right knee x-ray shows a osteochondroma in the tibia.  This is a benign cartilage growth.  If not better with physical therapy will consider an MRI.

## 2024-09-06 NOTE — Progress Notes (Signed)
 Pelvis x-ray shows mild bilateral hip arthritis

## 2024-09-09 NOTE — Telephone Encounter (Signed)
 Forwarding to Dr. Joane to review and advise. See message from pt below:  Per pt: This is the first I have heard of a kidney stone and scoliosis. What do I do for this moving forward?

## 2024-09-09 NOTE — Telephone Encounter (Signed)
 Forwarding to Dr. Joane to review and advise.   Per visit note 09/03/24:  Assessment and Plan: 38 y.o. male with multilevel pain.  Patient has participated in sports for a long time and has had multiple injuries as a result.  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   He also has chronic back pain hip pain and right knee pain.  He does have degenerative changes in all of these areas.   He is a good candidate for physical therapy.  Plan to refer to PT.  Consider occupational therapy for his hands as well.  Plan to recheck in 2 months.  Await radiology overread.

## 2024-09-10 NOTE — Addendum Note (Signed)
 Addended by: GEROME ILEANA RAMAN on: 09/10/2024 10:41 AM   Modules accepted: Orders

## 2024-09-14 NOTE — Addendum Note (Signed)
 Addended by: MARDY LEOTIS RAMAN on: 09/14/2024 02:27 PM   Modules accepted: Orders

## 2024-09-14 NOTE — Addendum Note (Signed)
 Addended by: MARDY LEOTIS RAMAN on: 09/14/2024 02:37 PM   Modules accepted: Orders

## 2024-09-14 NOTE — Telephone Encounter (Signed)
 Dr. Joane, OK to place referral to Rheumatology for eval?

## 2024-09-14 NOTE — Telephone Encounter (Signed)
 Rheumatology lab orders and referral to Rheumatology pending.   Forwarding to Dr. Joane to confirm correct orders.

## 2024-09-19 ENCOUNTER — Ambulatory Visit

## 2024-09-19 DIAGNOSIS — G8929 Other chronic pain: Secondary | ICD-10-CM

## 2024-09-22 ENCOUNTER — Other Ambulatory Visit

## 2024-09-22 DIAGNOSIS — M79641 Pain in right hand: Secondary | ICD-10-CM

## 2024-09-22 DIAGNOSIS — M2550 Pain in unspecified joint: Secondary | ICD-10-CM | POA: Diagnosis not present

## 2024-09-22 DIAGNOSIS — M25551 Pain in right hip: Secondary | ICD-10-CM

## 2024-09-22 DIAGNOSIS — N200 Calculus of kidney: Secondary | ICD-10-CM

## 2024-09-22 DIAGNOSIS — M545 Low back pain, unspecified: Secondary | ICD-10-CM | POA: Diagnosis not present

## 2024-09-22 DIAGNOSIS — G8929 Other chronic pain: Secondary | ICD-10-CM | POA: Diagnosis not present

## 2024-09-22 DIAGNOSIS — M79642 Pain in left hand: Secondary | ICD-10-CM

## 2024-09-22 DIAGNOSIS — M25561 Pain in right knee: Secondary | ICD-10-CM

## 2024-09-22 DIAGNOSIS — M25552 Pain in left hip: Secondary | ICD-10-CM

## 2024-09-22 LAB — SEDIMENTATION RATE: Sed Rate: 7 mm/h (ref 0–15)

## 2024-09-24 ENCOUNTER — Ambulatory Visit: Payer: Self-pay | Admitting: Family Medicine

## 2024-09-24 LAB — RHEUMATOID FACTOR: Rheumatoid fact SerPl-aCnc: <10 [IU]/mL (ref <14)

## 2024-09-24 LAB — CYCLIC CITRUL PEPTIDE ANTIBODY, IGG: Cyclic Citrullin Peptide Ab: <16 U

## 2024-09-24 LAB — ANA: Anti Nuclear Antibody (ANA): NEGATIVE

## 2024-09-24 LAB — HLA-B27 ANTIGEN: HLA-B27 Antigen: NEGATIVE

## 2024-09-24 NOTE — Progress Notes (Signed)
 Knee MRI shows some arthritis underneath the kneecap.  Additionally there are some arthritis in the lateral aspect of the knee joint.  Additionally of a little bit of patellar tendinitis.  Overall MRI looks pretty mild but does show enough changes to explain pain.  Physical therapy certainly could help.

## 2024-09-27 NOTE — Progress Notes (Signed)
Rheumatology labs look normal.

## 2024-10-06 ENCOUNTER — Encounter: Payer: Self-pay | Admitting: Physical Therapy

## 2024-10-06 ENCOUNTER — Ambulatory Visit: Admitting: Physical Therapy

## 2024-10-06 DIAGNOSIS — M79642 Pain in left hand: Secondary | ICD-10-CM | POA: Insufficient documentation

## 2024-10-06 DIAGNOSIS — M25652 Stiffness of left hip, not elsewhere classified: Secondary | ICD-10-CM | POA: Diagnosis present

## 2024-10-06 DIAGNOSIS — M79641 Pain in right hand: Secondary | ICD-10-CM | POA: Insufficient documentation

## 2024-10-06 DIAGNOSIS — M25561 Pain in right knee: Secondary | ICD-10-CM | POA: Diagnosis not present

## 2024-10-06 DIAGNOSIS — M25542 Pain in joints of left hand: Secondary | ICD-10-CM | POA: Diagnosis present

## 2024-10-06 DIAGNOSIS — M545 Low back pain, unspecified: Secondary | ICD-10-CM | POA: Insufficient documentation

## 2024-10-06 DIAGNOSIS — M6281 Muscle weakness (generalized): Secondary | ICD-10-CM | POA: Insufficient documentation

## 2024-10-06 DIAGNOSIS — G8929 Other chronic pain: Secondary | ICD-10-CM | POA: Insufficient documentation

## 2024-10-06 DIAGNOSIS — M25551 Pain in right hip: Secondary | ICD-10-CM | POA: Diagnosis present

## 2024-10-06 DIAGNOSIS — M25552 Pain in left hip: Secondary | ICD-10-CM | POA: Diagnosis not present

## 2024-10-06 DIAGNOSIS — M5459 Other low back pain: Secondary | ICD-10-CM | POA: Diagnosis present

## 2024-10-06 DIAGNOSIS — M25651 Stiffness of right hip, not elsewhere classified: Secondary | ICD-10-CM | POA: Diagnosis present

## 2024-10-06 DIAGNOSIS — M2550 Pain in unspecified joint: Secondary | ICD-10-CM | POA: Insufficient documentation

## 2024-10-06 DIAGNOSIS — M25541 Pain in joints of right hand: Secondary | ICD-10-CM | POA: Diagnosis present

## 2024-10-06 NOTE — Therapy (Signed)
 OUTPATIENT PHYSICAL THERAPY LOWER EXTREMITY EVALUATION   Patient Name: Jay Huerta MRN: [id number] DOB:1986-05-06, 38 y.o., male Today's Date: 10/06/2024  END OF SESSION:  PT End of Session - 10/06/24 0804     Visit Number 1    Date for Recertification  01/03/25    Authorization Type [employer] [place]    PT Start Time 0802    PT Stop Time 0845    PT Time Calculation (min) 43 min    Activity Tolerance Patient tolerated treatment well    Behavior During Therapy [facility] for tasks assessed/performed          History reviewed. No pertinent past medical history. Past Surgical History:  Procedure Laterality Date   INGUINAL HERNIA REPAIR     Patient Active Problem List   Diagnosis Date Noted   Low back pain 09/03/2024   Dextroscoliosis 03/30/2019   Elevated BP without diagnosis of hypertension 03/30/2019    PCP: Joane, MD  REFERRING PROVIDER: Joane MD  REFERRING DIAG: bilateral knee, back pain, hand pain and hip pain  THERAPY DIAG:  Pain in right hip  Muscle weakness  Stiffness of right hip, not elsewhere classified  Other low back pain  Stiffness of left hip, not elsewhere classified  Pain of joint of both hands  Rationale for Evaluation and Treatment: Rehabilitation  ONSET DATE: 09/03/24  SUBJECTIVE:   SUBJECTIVE STATEMENT:  Patient reports that he has multiple c/o pain in hands, knees, hips and back.  Patient report that he has pain with bending over, ache in the wrists.  Hips and knees do hurt after playing basketball   X-ray images bilateral hands lumbar spine pelvis and right knee obtained today personally and independently interpreted.   Right hand: Mild degenerative changes.  No severe wrist DJD.  No acute fractures.   Left hand: Degenerative changes of the left wrist radiocarpal joint.  Mild degenerative changes into the hand.  No acute fractures are visible.   Lumbar spine: Mild DDD L5-S1 and some facet DJD L5-S1.  No acute fractures are visible.    Pelvis: Mild degenerative changes bilateral hips right worse than left.  No acute fractures are visible.   Right knee: Mild degenerative changes.  Sclerosis or increased density present at lateral tibia or fibula.  Question benign cartilage growth.  PERTINENT HISTORY: HTN, LBP, poly arthralgia PAIN:  Are you having pain? Yes: NPRS scale: 0/10 Pain location: low back, hips, knees and wrists Pain description: tight , aches Aggravating factors: playing basketball, bending pain up to 8/10 Relieving factors: Tylenol , rest and pain will go away  PRECAUTIONS: None  RED FLAGS: None   WEIGHT BEARING RESTRICTIONS: No  FALLS:  Has patient fallen in last 6 months? 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  DIAGNOSTIC FINDINGS: see chart  PATIENT SURVEYS:  ODI 26%  COGNITION: Overall cognitive status: Within functional limits for tasks assessed     SENSATION: WFL  EDEMA:  none  MUSCLE LENGTH:  POSTURE: rounded shoulders, forward head, decreased lumbar lordosis, and slouched sitting posture  PALPATION: Mild tightness in  the low back   Lumbar ROM:  decreased 50%    LOWER EXTREMITY ROM:  WFL's   LOWER EXTREMITY MMT:  hips 5/5, knees 4+/5, core 4/5   LOWER EXTREMITY SPECIAL TESTS:  SLR 40 degrees with pain in HS and back  GAIT: Distance walked: 100' Assistive device utilized: None Level of assistance: Complete Independence Comments: reports an ache and a limp after playing basketball                                                                                                                                 TREATMENT DATE:  10/06/24 Evaluation    PATIENT EDUCATION:  Education details: HEP/POC Person educated: Patient Education method: Programmer, Multimedia, Facilities Manager, and Handouts Education comprehension: verbalized understanding  HOME EXERCISE PROGRAM: Access Code: [email or web address] URL: [email or web address] Date: 10/06/2024 Prepared by: Ozell Mainland  Exercises - Supine Bridge  - 1 x daily - 7 x weekly - 3 sets - 10 reps - 3 hold - Supine Lower Trunk Rotation  - 1 x daily - 7 x weekly - 3 sets - 10 reps - 5 hold - Seated Hamstring Stretch with Chair  - 1 x daily - 7 x weekly - 1 sets - 10 reps - 30 hold - Seated Table Hamstring Stretch  - 1 x daily - 7 x weekly - 1 sets - 10 reps - 30 hold - Supine Piriformis Stretch Pulling Heel to Hip  - 1 x daily - 7 x weekly - 1 sets - 10 reps - 30 hold - Prone Quadriceps Stretch  - 1 x daily - 7 x weekly - 1 sets - 10 reps - 30 hold  ASSESSMENT:  CLINICAL IMPRESSION: Patient is a 38 y.o. male who was seen today for physical therapy evaluation and treatment for back, hips, knees and hand pain.  He is very active playing basketball, he has some diagnosis of old injuries from basketball and some arthritis in the knees, hips and back, he is very tight in the HS and  back mms with limited SLR and lumbar flexion with pain.  We discussed the need for strong flexible joints and core  OBJECTIVE IMPAIRMENTS: Abnormal gait, cardiopulmonary status limiting activity, decreased activity tolerance, decreased balance, decreased coordination, decreased endurance, decreased mobility, difficulty walking, decreased ROM, decreased strength, increased edema, increased muscle spasms, impaired flexibility, improper body mechanics, postural dysfunction, and pain.   REHAB POTENTIAL: Good  CLINICAL DECISION MAKING: Stable/uncomplicated  EVALUATION COMPLEXITY:  Low   GOALS: Goals reviewed with patient? Yes  SHORT TERM GOALS: Target date: 10/20/24 Independent with initial HEP Baseline: Goal status: INITIAL  LONG TERM GOALS: Target date: 01/04/25  Independent with advanced HEP and stretching Baseline:  Goal status: INITIAL  2.  Increase SLR to 60 degrees Baseline:  Goal status: INITIAL  3.  Improve ODI to 12 % Baseline:  Goal status: INITIAL  4.  Report overall pain with basketball decreased 25% Baseline: up to  8/10 Goal status: INITIAL  PLAN:  PT FREQUENCY: 1x/week  PT DURATION: 12 weeks  PLANNED INTERVENTIONS: 97164- PT Re-evaluation, 97110-Therapeutic exercises, 97530- Therapeutic activity, 97112- Neuromuscular re-education, 97535- Self Care, 02859- Manual therapy, [phone]- Gait training, Patient/Family education, Balance training, Stair training, Taping, Joint mobilization, Cryotherapy, and Moist heat  PLAN FOR NEXT SESSION: core and flexibility   Marisel Tostenson W, PT 10/06/2024, 8:04 AM

## 2024-10-25 ENCOUNTER — Ambulatory Visit: Attending: Family Medicine | Admitting: Physical Therapy

## 2024-10-25 ENCOUNTER — Encounter: Payer: Self-pay | Admitting: Physical Therapy

## 2024-10-25 DIAGNOSIS — M25651 Stiffness of right hip, not elsewhere classified: Secondary | ICD-10-CM | POA: Insufficient documentation

## 2024-10-25 DIAGNOSIS — M25551 Pain in right hip: Secondary | ICD-10-CM | POA: Diagnosis present

## 2024-10-25 DIAGNOSIS — M25541 Pain in joints of right hand: Secondary | ICD-10-CM | POA: Insufficient documentation

## 2024-10-25 DIAGNOSIS — M5459 Other low back pain: Secondary | ICD-10-CM | POA: Insufficient documentation

## 2024-10-25 DIAGNOSIS — M25652 Stiffness of left hip, not elsewhere classified: Secondary | ICD-10-CM | POA: Diagnosis present

## 2024-10-25 DIAGNOSIS — M25542 Pain in joints of left hand: Secondary | ICD-10-CM | POA: Insufficient documentation

## 2024-10-25 DIAGNOSIS — M6281 Muscle weakness (generalized): Secondary | ICD-10-CM | POA: Diagnosis present

## 2024-10-25 NOTE — Therapy (Signed)
 " OUTPATIENT PHYSICAL THERAPY LOWER EXTREMITY TREATMENT   Patient Name: Jay Huerta MRN: [id number] DOB:August 03, 1986, 39 y.o., male Today's Date: 10/25/2024  END OF SESSION:  PT End of Session - 10/25/24 0814     Visit Number 2    Date for Recertification  01/03/25    PT Start Time 0814    PT Stop Time 0945    PT Time Calculation (min) 91 min    Activity Tolerance Patient tolerated treatment well    Behavior During Therapy [facility] for tasks assessed/performed          History reviewed. No pertinent past medical history. Past Surgical History:  Procedure Laterality Date   INGUINAL HERNIA REPAIR     Patient Active Problem List   Diagnosis Date Noted   Low back pain 09/03/2024   Dextroscoliosis 03/30/2019   Elevated BP without diagnosis of hypertension 03/30/2019    PCP: Joane, MD  REFERRING PROVIDER: Joane MD  REFERRING DIAG: bilateral knee, back pain, hand pain and hip pain  THERAPY DIAG:  Pain in right hip  Muscle weakness  Stiffness of right hip, not elsewhere classified  Other low back pain  Stiffness of left hip, not elsewhere classified  Pain of joint of both hands  Rationale for Evaluation and Treatment: Rehabilitation  ONSET DATE: 09/03/24  SUBJECTIVE:   SUBJECTIVE STATEMENT:  Back is hurting some, positional thing    Patient reports that he has multiple c/o pain in hands, knees, hips and back.  Patient report that he has pain with bending over, ache in the wrists.  Hips and knees do hurt after playing basketball   X-ray images bilateral hands lumbar spine pelvis and right knee obtained today personally and independently interpreted.   Right hand: Mild degenerative changes.  No severe wrist DJD.  No acute fractures.   Left hand: Degenerative changes of the left wrist radiocarpal joint.  Mild degenerative changes into the hand.  No acute fractures are visible.   Lumbar spine: Mild DDD L5-S1 and some facet DJD L5-S1.  No acute fractures are  visible.   Pelvis: Mild degenerative changes bilateral hips right worse than left.  No acute fractures are visible.   Right knee: Mild degenerative changes.  Sclerosis or increased density present at lateral tibia or fibula.  Question benign cartilage growth.  PERTINENT HISTORY: HTN, LBP, poly arthralgia PAIN:  Are you having pain? Yes: NPRS scale: 0/10 Pain location: low back, hips, knees and wrists Pain description: tight , aches Aggravating factors: playing basketball, bending pain up to 8/10 Relieving factors: Tylenol , rest and pain will go away  PRECAUTIONS: None  RED FLAGS: None   WEIGHT BEARING RESTRICTIONS: No  FALLS:  Has patient fallen in last 6 months? 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  DIAGNOSTIC FINDINGS: see chart  PATIENT SURVEYS:  ODI 26%  COGNITION: Overall cognitive status: Within functional limits for tasks assessed     SENSATION: WFL  EDEMA:  none  MUSCLE LENGTH:  POSTURE: rounded shoulders, forward head, decreased lumbar lordosis, and slouched sitting posture  PALPATION:  Mild tightness in the low back   Lumbar ROM:  decreased 50%    LOWER EXTREMITY ROM:  WFL's   LOWER EXTREMITY MMT:  hips 5/5, knees 4+/5, core 4/5   LOWER EXTREMITY SPECIAL TESTS:  SLR 40 degrees with pain in HS and back  GAIT: Distance walked: 100' Assistive device utilized: None Level of assistance: Complete Independence Comments: reports an ache and a limp after playing basketball                                                                                                                                 TREATMENT DATE:  10/26/23 NuStep L5 x 6 min Shoulder Ext 15lb 2x10 AR Press 25lb 2x10 Rows and Lats 45lb 2x10  Bridges x10 LE on Pball bridges, K2X, Oblq  Passive HS Stretching   10/06/24 Evaluation    PATIENT EDUCATION:  Education details: HEP/POC Person educated: Patient Education method: Programmer, Multimedia, Facilities Manager, and Handouts Education comprehension: verbalized understanding  HOME EXERCISE PROGRAM: Access Code: [email or web address] URL: [email or web address] Date: 10/06/2024 Prepared by: Ozell Mainland  Exercises - Supine Bridge  - 1 x daily - 7 x weekly - 3 sets - 10 reps - 3 hold - Supine Lower Trunk Rotation  - 1 x daily - 7 x weekly - 3 sets - 10 reps - 5 hold - Seated Hamstring Stretch with Chair  - 1 x daily - 7 x weekly - 1 sets - 10 reps - 30 hold - Seated Table Hamstring Stretch  - 1 x daily - 7 x weekly - 1 sets - 10 reps - 30 hold - Supine Piriformis Stretch Pulling Heel to Hip  - 1 x daily - 7 x weekly - 1 sets - 10 reps - 30 hold - Prone Quadriceps Stretch  - 1 x daily - 7 x weekly - 1 sets - 10 reps - 30 hold  ASSESSMENT:  CLINICAL IMPRESSION: Patient is a 39 y.o. male who was seen today for physical therapy treatment for back, hips, knees and hand pain. He enters ~ 14 minutes late.  He is very active playing basketball, he has some diagnosis of old injuries from basketball and some arthritis in the knees, hips and back, he is very tight in the HS and  back mms with limited SLR and lumbar flexion with pain. Session focused on postural strength. Tactile cue needed to keep shoulder down nd back with rows and extensions. Some core weakness noted today and well as  bilateral HS tightness.   OBJECTIVE IMPAIRMENTS: Abnormal gait, cardiopulmonary status limiting activity, decreased activity tolerance, decreased balance, decreased coordination, decreased  endurance, decreased mobility, difficulty walking, decreased ROM, decreased strength, increased edema, increased muscle spasms, impaired flexibility, improper body mechanics, postural dysfunction, and pain.   REHAB POTENTIAL: Good  CLINICAL DECISION MAKING: Stable/uncomplicated  EVALUATION COMPLEXITY: Low   GOALS: Goals reviewed with patient? Yes  SHORT TERM GOALS: Target date: 10/20/24 Independent with initial HEP Baseline:  Goal status: INITIAL  LONG TERM GOALS: Target date: 01/04/25  Independent with advanced HEP and stretching Baseline:  Goal status: INITIAL  2.  Increase SLR to 60 degrees Baseline:  Goal status: INITIAL  3.  Improve ODI to 12 % Baseline:  Goal status: INITIAL  4.  Report overall pain with basketball decreased 25% Baseline: up to 8/10 Goal status: INITIAL  PLAN:  PT FREQUENCY: 1x/week  PT DURATION: 12 weeks  PLANNED INTERVENTIONS: 97164- PT Re-evaluation, 97110-Therapeutic exercises, 97530- Therapeutic activity, 97112- Neuromuscular re-education, 97535- Self Care, 02859- Manual therapy, [phone]- Gait training, Patient/Family education, Balance training, Stair training, Taping, Joint mobilization, Cryotherapy, and Moist heat  PLAN FOR NEXT SESSION: core and flexibility   Tanda KANDICE Sorrow, PTA 10/25/2024, 8:15 AM  "

## 2024-11-03 NOTE — Progress Notes (Signed)
 "        I, Jay Huerta, CMA acting as a scribe for Jay Lloyd, MD.  Jay Huerta is a 39 y.o. male who presents to [facility] at [facility] today for 5-month f/u multilevel pain; low back, hip, hands, and R knee. Pt was last seen by Dr. Lloyd on 09/03/24 and was referred to PT, completing 2 visits.  Later, pt requested a referral to rheumatology. Labs were 1st ordered.  Based on XR findings and knee MRI was ordered to better look at the osteochondroma.  Today, pt reports continued lower and mid-back pain. Spasm in the mid back / left flank. Notes slumping a lot while sitting. Also c/o left wrist pain. Denies swelling n/t/w. Using Voltaren Gel prn for hand and wrist pain, also for knee pain. R knee has improved some since not playing basketball.   Dx testing: 09/22/24 Labs 09/19/24 R knee MRI  09/03/24 R & L hand, pelvis, R knee, & L-spine XR  Pertinent review of systems: No fevers or chills  Relevant historical information: Physically active plays basketball.   Exam:  BP 132/80   Pulse 71   Ht 6' 5 (1.956 m)   Wt 249 lb (112.9 kg)   SpO2 98%   BMI 29.53 kg/m  General: Well Developed, well nourished, and in no acute distress.   MSK: Hands bilaterally degenerative appearing with some swelling.  Left wrist mild effusion compared to right.  Normal motion.  L-spine: Normal-appearing Mildly tender palpation left lumbar paraspinal musculature. Normal lumbar motion.  Right knee no significant effusion.  Normal motion with crepitation.    Lab and Radiology Results  MR KNEE RIGHT WO CONTRAST Result Date: 09/23/2024 CLINICAL DATA:  Right knee pain for few months, no known injury EXAM: MRI OF THE RIGHT KNEE WITHOUT CONTRAST TECHNIQUE: Multiplanar, multisequence MR imaging of the knee was performed. No intravenous contrast was administered. COMPARISON:  None Available. FINDINGS: MENISCI Medial: Intact. Lateral: Intact. LIGAMENTS Cruciates: ACL and PCL are intact.  Collaterals: Medial collateral ligament is intact. Lateral collateral ligament complex is intact. CARTILAGE Patellofemoral: Chondral fissuring of the lateral patellar facet and patellar apex with subchondral marrow edema in the lateral patellar facet. Chondral fissuring of the central trochlea with subchondral marrow edema. Mild chondral fissuring of the superior aspect of the medial and lateral trochlea. Medial: Chondral fissuring of the lateral aspect of the medial tibial plateau. Lateral:  Mild chondral fissuring of the lateral tibial plateau. JOINT: No joint effusion. Normal Hoffa's fat-pad. No plical thickening. POPLITEAL FOSSA: Popliteus tendon is intact. No Baker's cyst. EXTENSOR MECHANISM: Intact quadriceps tendon. Mild proximal patellar tendinosis. Intact lateral patellar retinaculum. Intact medial patellar retinaculum. Intact MPFL. BONES: No aggressive osseous lesion. No fracture or dislocation. Other: No fluid collection or hematoma. Muscles are normal. IMPRESSION: 1. No meniscal or ligamentous injury of the right knee. 2. Chondral fissuring of the lateral patellar facet and patellar apex with subchondral marrow edema in the lateral patellar facet. Chondral fissuring of the central trochlea with subchondral marrow edema. Mild chondral fissuring of the superior aspect of the medial and lateral trochlea. 3. Chondral fissuring of the lateral aspect of the medial tibial plateau. 4. Mild proximal patellar tendinosis. Electronically Signed   By: Julaine Blanch M.D.   On: 09/23/2024 14:33   DG Pelvis 1-2 Views Result Date: 09/04/2024 EXAM: 1 or 2 VIEW(S) XRAY OF THE PELVIS 09/03/2024 09:09:51 AM COMPARISON: CT 04/01/2014. CLINICAL HISTORY: hip pain FINDINGS: BONES AND JOINTS: No acute fracture.  No focal osseous lesion. No joint dislocation. Mild degenerative changes of bilateral hips with osteophyte formation and mild superior joint space narrowing. Chronic bilateral sacroiliitis. SOFT TISSUES: The soft  tissues are unremarkable. IMPRESSION: 1. Mild bilateral hip osteoarthritis with mild superior joint space narrowing and osteophyte formation. 2. Chronic bilateral sacroiliitis. Electronically signed by: Dayne Hassell MD 09/04/2024 05:31 PM EST RP Workstation: [id number]   DG Hand Complete Right Result Date: 09/04/2024 EXAM: 3 or more VIEW(S) XRAY OF THE RIGHT HAND 09/03/2024 09:09:51 AM COMPARISON: 09/25/2008 CLINICAL HISTORY: right hand pain FINDINGS: BONES AND JOINTS: Chronic healed fracture deformities at the base of the second and third middle phalanges. No joint dislocation. SOFT TISSUES: The soft tissues are unremarkable. IMPRESSION: 1. No acute osseous abnormality. 2. Chronic healed fracture deformities at the bases of the second and third middle phalanges. Electronically signed by: Dayne Hassell MD 09/04/2024 05:30 PM EST RP Workstation: [id number]   DG Hand Complete Left Result Date: 09/04/2024 EXAM: 3 OR MORE VIEW(S) XRAY OF THE LEFT HAND 09/03/2024 09:09:51 AM COMPARISON: None available. CLINICAL HISTORY: left hand pain FINDINGS: BONES AND JOINTS: No acute fracture. No joint dislocation. Degenerative changes at the radiocarpal joint with sclerosis along the radial margin of the scaphoid and osteophytes at the radial margin of the joint. Mild degenerative changes in the interphalangeal joints. Flexion deformity at the third distal interphalangeal joint. SOFT TISSUES: The soft tissues are unremarkable. IMPRESSION: 1. Degenerative changes at the radiocarpal joint with sclerosis along the radial margin of the scaphoid and osteophytes at the radial margin of the joint. 2. Mild degenerative changes in the interphalangeal joints. 3. Flexion deformity at the third distal interphalangeal joint. Electronically signed by: Dayne Hassell MD 09/04/2024 05:28 PM EST RP Workstation: [id number]   DG Lumbar Spine 2-3 Views Result Date: 09/04/2024 EXAM: 2 or 3 VIEW(S) XRAY OF THE LUMBAR SPINE 09/03/2024 09:09:51  AM COMPARISON: 03/27/2015 CLINICAL HISTORY: chronic LBP FINDINGS: LUMBAR SPINE: BONES: There is a mild lumbar dextroscoliosis without underlying vertebral anomaly. No acute fracture. No aggressive appearing osseous lesion. DISCS AND DEGENERATIVE CHANGES: No severe degenerative changes. SOFT TISSUES: 5 mm calcification projects over the right renal collecting system. IMPRESSION: 1. Mild lumbar dextroscoliosis without underlying vertebral anomaly. 2. 5 mm calcification projecting over the right renal collecting system, suggesting nephrolithiasis. Electronically signed by: Dayne Hassell MD 09/04/2024 05:26 PM EST RP Workstation: [id number]   DG Knee AP/LAT W/Sunrise Right Result Date: 09/04/2024 EXAM: 3 VIEW(S) XRAY OF THE R KNEE 09/03/2024 09:09:51 AM COMPARISON: None available. CLINICAL HISTORY: chronic R knee pain FINDINGS: BONES AND JOINTS: Benign exostosis along lateral aspect of proximal tibia. No acute fracture. No joint dislocation. No significant joint effusion. No significant degenerative changes. SOFT TISSUES: The soft tissues are unremarkable. IMPRESSION: 1. Benign exostosis along the lateral proximal tibia, compatible with osteochondroma, without aggressive features or complication. MRI if painful. Electronically signed by: Dayne Hassell MD 09/04/2024 05:23 PM EST RP Workstation: [id number]   Jay Huerta Jay Huerta, personally (independently) visualized and performed the interpretation of the images attached in this note.     Assessment and Plan: 39 y.o. male with multifactorial joint pain bilateral hands and wrist, lumbar spine and right knee.  Generally all due to degenerative changes.  Physical therapy is just getting started and should be quite helpful for the majority of the pain.  We could also use a hand therapy/Occupational Therapy in the future if needed at the [place] location.  We talked about medication options.  I have prescribed ibuprofen  and methocarbamol .  He can also use Tylenol   arthritis and topical diclofenac gel.  We could proceed with a steroid injection in the knee or hands or wrists at any time if needed.  Additionally if needed further evaluation lumbar spine could be further evaluated with MRI. Jay Huerta Jay Huerta will keep me updated and check back as needed.  PDMP not reviewed this encounter. No orders of the defined types were placed in this encounter.  Meds ordered this encounter  Medications   ibuprofen  (ADVIL ) 800 MG tablet    Sig: Take 1 tablet (800 mg total) by mouth every 8 (eight) hours as needed.    Dispense:  90 tablet    Refill:  1   methocarbamol  (ROBAXIN ) 500 MG tablet    Sig: Take 1 tablet (500 mg total) by mouth every 8 (eight) hours as needed for muscle spasms.    Dispense:  90 tablet    Refill:  1     Discussed warning signs or symptoms. Please see discharge instructions. Patient expresses understanding.   The above documentation has been reviewed and is accurate and complete Jay Huerta, M.D.   "

## 2024-11-04 ENCOUNTER — Encounter: Payer: Self-pay | Admitting: Family Medicine

## 2024-11-04 ENCOUNTER — Ambulatory Visit: Admitting: Family Medicine

## 2024-11-04 VITALS — BP 132/80 | HR 71 | Ht 77.0 in | Wt 249.0 lb

## 2024-11-04 DIAGNOSIS — M1711 Unilateral primary osteoarthritis, right knee: Secondary | ICD-10-CM

## 2024-11-04 DIAGNOSIS — M19041 Primary osteoarthritis, right hand: Secondary | ICD-10-CM

## 2024-11-04 DIAGNOSIS — M79642 Pain in left hand: Secondary | ICD-10-CM | POA: Diagnosis not present

## 2024-11-04 DIAGNOSIS — M19042 Primary osteoarthritis, left hand: Secondary | ICD-10-CM

## 2024-11-04 DIAGNOSIS — M545 Low back pain, unspecified: Secondary | ICD-10-CM

## 2024-11-04 DIAGNOSIS — M79641 Pain in right hand: Secondary | ICD-10-CM

## 2024-11-04 DIAGNOSIS — M19049 Primary osteoarthritis, unspecified hand: Secondary | ICD-10-CM | POA: Insufficient documentation

## 2024-11-04 DIAGNOSIS — M25561 Pain in right knee: Secondary | ICD-10-CM

## 2024-11-04 DIAGNOSIS — G8929 Other chronic pain: Secondary | ICD-10-CM

## 2024-11-04 MED ORDER — IBUPROFEN 800 MG PO TABS: 800.0000 mg | ORAL_TABLET | Freq: Three times a day (TID) | ORAL | 1 refills | Status: AC | PRN

## 2024-11-04 MED ORDER — METHOCARBAMOL 500 MG PO TABS: 500.0000 mg | ORAL_TABLET | Freq: Three times a day (TID) | ORAL | 1 refills | Status: AC | PRN

## 2024-11-04 NOTE — Patient Instructions (Addendum)
 Thank you for coming in today.   Continue physical therapy  I can add hand therapy if needed  I can do injections if needed  Check back as needed

## 2024-11-08 ENCOUNTER — Ambulatory Visit: Admitting: Physical Therapy

## 2024-11-08 ENCOUNTER — Encounter: Payer: Self-pay | Admitting: Physical Therapy

## 2024-11-08 DIAGNOSIS — M6281 Muscle weakness (generalized): Secondary | ICD-10-CM

## 2024-11-08 DIAGNOSIS — M25651 Stiffness of right hip, not elsewhere classified: Secondary | ICD-10-CM

## 2024-11-08 DIAGNOSIS — M25652 Stiffness of left hip, not elsewhere classified: Secondary | ICD-10-CM

## 2024-11-08 DIAGNOSIS — M25551 Pain in right hip: Secondary | ICD-10-CM | POA: Diagnosis not present

## 2024-11-08 DIAGNOSIS — M5459 Other low back pain: Secondary | ICD-10-CM

## 2024-11-08 NOTE — Therapy (Signed)
 " OUTPATIENT PHYSICAL THERAPY LOWER EXTREMITY TREATMENT   Patient Name: Jay Huerta MRN: [id number] DOB:06-Jul-1986, 39 y.o., male Today's Date: 11/08/2024  END OF SESSION:  PT End of Session - 11/08/24 0807     Visit Number 3    Date for Recertification  01/03/25    PT Start Time 0807    PT Stop Time 0845    PT Time Calculation (min) 38 min    Activity Tolerance Patient tolerated treatment well    Behavior During Therapy [facility] for tasks assessed/performed          History reviewed. No pertinent past medical history. Past Surgical History:  Procedure Laterality Date   INGUINAL HERNIA REPAIR     Patient Active Problem List   Diagnosis Date Noted   Hand arthritis 11/04/2024   Right knee DJD 11/04/2024   Low back pain 09/03/2024   Dextroscoliosis 03/30/2019   Elevated BP without diagnosis of hypertension 03/30/2019    PCP: Joane, MD  REFERRING PROVIDER: Joane MD  REFERRING DIAG: bilateral knee, back pain, hand pain and hip pain  THERAPY DIAG:  Pain in right hip  Muscle weakness  Stiffness of right hip, not elsewhere classified  Other low back pain  Stiffness of left hip, not elsewhere classified  Rationale for Evaluation and Treatment: Rehabilitation  ONSET DATE: 09/03/24  SUBJECTIVE:   SUBJECTIVE STATEMENT: About the same    Patient reports that he has multiple c/o pain in hands, knees, hips and back.  Patient report that he has pain with bending over, ache in the wrists.  Hips and knees do hurt after playing basketball   X-ray images bilateral hands lumbar spine pelvis and right knee obtained today personally and independently interpreted.   Right hand: Mild degenerative changes.  No severe wrist DJD.  No acute fractures.   Left hand: Degenerative changes of the left wrist radiocarpal joint.  Mild degenerative changes into the hand.  No acute fractures are visible.   Lumbar spine: Mild DDD L5-S1 and some facet DJD L5-S1.  No acute fractures are  visible.   Pelvis: Mild degenerative changes bilateral hips right worse than left.  No acute fractures are visible.   Right knee: Mild degenerative changes.  Sclerosis or increased density present at lateral tibia or fibula.  Question benign cartilage growth.  PERTINENT HISTORY: HTN, LBP, poly arthralgia PAIN:  Are you having pain? Yes: NPRS scale: 3/10 Pain location: low back, hips, knees and wrists Pain description: tight , aches Aggravating factors: playing basketball, bending pain up to 8/10 Relieving factors: Tylenol , rest and pain will go away  PRECAUTIONS: None  RED FLAGS: None   WEIGHT BEARING RESTRICTIONS: No  FALLS:  Has patient fallen in last 6 months? 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  DIAGNOSTIC FINDINGS: see chart  PATIENT SURVEYS:  ODI 26%  COGNITION: Overall cognitive status: Within functional limits for tasks assessed     SENSATION: WFL  EDEMA:  none  MUSCLE LENGTH:  POSTURE: rounded shoulders, forward head, decreased lumbar lordosis, and slouched sitting posture  PALPATION:  Mild tightness in the low back   Lumbar ROM:  decreased 50%    LOWER EXTREMITY ROM:  WFL's   LOWER EXTREMITY MMT:  hips 5/5, knees 4+/5, core 4/5   LOWER EXTREMITY SPECIAL TESTS:  SLR 40 degrees with pain in HS and back  GAIT: Distance walked: 100' Assistive device utilized: None Level of assistance: Complete Independence Comments: reports an ache and a limp after playing basketball                                                                                                                                 TREATMENT DATE:  11/08/24 Bike L3.5 x 6 min Sit to Stand OHP w/ blue ball 2x10 Standing Rows 20lb 2x10 Hip Ext 15lb 2x10 each Bridges 2x15 Dead Bugs 2x10 Self K2C stretch Passive HS stretch  10/26/23 NuStep L5 x 6 min Shoulder Ext 15lb 2x10 AR Press 25lb 2x10 Rows and Lats 45lb 2x10  Bridges x10 LE on Pball bridges, K2C, Oblq  Passive HS Stretching   10/06/24 Evaluation    PATIENT EDUCATION:  Education details: HEP/POC Person educated: Patient Education method: Programmer, Multimedia, Facilities Manager, and Handouts Education comprehension: verbalized understanding  HOME EXERCISE PROGRAM: Access Code: [email or web address] URL: [email or web address] Date: 10/06/2024 Prepared by: Ozell Mainland  Exercises - Supine Bridge  - 1 x daily - 7 x weekly - 3 sets - 10 reps - 3 hold - Supine Lower Trunk Rotation  - 1 x daily - 7 x weekly - 3 sets - 10 reps - 5 hold - Seated Hamstring Stretch with Chair  - 1 x daily - 7 x weekly - 1 sets - 10 reps - 30 hold - Seated Table Hamstring Stretch  - 1 x daily - 7 x weekly - 1 sets - 10 reps - 30 hold - Supine Piriformis Stretch Pulling Heel to Hip  - 1 x daily - 7 x weekly - 1 sets - 10 reps - 30 hold - Prone Quadriceps Stretch  - 1 x daily - 7 x weekly - 1 sets - 10 reps - 30 hold  ASSESSMENT:  CLINICAL IMPRESSION: Patient is a 39 y.o. male who was seen today for physical therapy treatment for back, hips, knees and hand pain. He enters ~ 6 minutes late.  He is very active playing basketball, he has some diagnosis of old injuries from basketball and some arthritis in the knees, hips and back, he is very tight in the HS and  back mms with limited SLR and lumbar flexion with pain. Again session focused on postural strength. Expresses the importance of more HEP compliance. Tactile cues for posture needed with standing rows and hip extentons.  Some  core weakness with dead bugs.Bilateral HS tightness remains.   OBJECTIVE IMPAIRMENTS: Abnormal gait, cardiopulmonary status limiting activity, decreased activity tolerance, decreased balance, decreased coordination, decreased endurance, decreased mobility, difficulty walking, decreased ROM, decreased strength, increased edema, increased muscle spasms, impaired flexibility, improper body  mechanics, postural dysfunction, and pain.   REHAB POTENTIAL: Good  CLINICAL DECISION MAKING: Stable/uncomplicated  EVALUATION COMPLEXITY: Low   GOALS: Goals reviewed with patient? Yes  SHORT TERM GOALS: Target date: 10/20/24 Independent with initial HEP Baseline: Goal status: Progressing 11/08/24  LONG TERM GOALS: Target date: 01/04/25  Independent with advanced HEP and stretching Baseline:  Goal status: INITIAL  2.  Increase SLR to 60 degrees Baseline:  Goal status: INITIAL  3.  Improve ODI to 12 % Baseline:  Goal status: INITIAL  4.  Report overall pain with basketball decreased 25% Baseline: up to 8/10 Goal status: Ongoing 11/08/24  PLAN:  PT FREQUENCY: 1x/week  PT DURATION: 12 weeks  PLANNED INTERVENTIONS: 97164- PT Re-evaluation, 97110-Therapeutic exercises, 97530- Therapeutic activity, 97112- Neuromuscular re-education, 97535- Self Care, [id number]- Manual therapy, [phone]- Gait training, Patient/Family education, Balance training, Stair training, Taping, Joint mobilization, Cryotherapy, and Moist heat  PLAN FOR NEXT SESSION: core and flexibility   Tanda KANDICE Sorrow, PTA 11/08/2024, 8:08 AM  "

## 2024-11-22 ENCOUNTER — Ambulatory Visit: Admitting: Physical Therapy
